# Patient Record
Sex: Female | Born: 1965
Health system: Southern US, Community
[De-identification: ages and names within clinical notes are randomized; demographics above are authoritative.]

## PROBLEM LIST (undated history)

## (undated) DIAGNOSIS — E611 Iron deficiency: Secondary | ICD-10-CM

## (undated) DIAGNOSIS — K648 Other hemorrhoids: Secondary | ICD-10-CM

## (undated) DIAGNOSIS — G8929 Other chronic pain: Secondary | ICD-10-CM

## (undated) DIAGNOSIS — E669 Obesity, unspecified: Secondary | ICD-10-CM

## (undated) DIAGNOSIS — K219 Gastro-esophageal reflux disease without esophagitis: Secondary | ICD-10-CM

## (undated) DIAGNOSIS — G4700 Insomnia, unspecified: Secondary | ICD-10-CM

## (undated) DIAGNOSIS — M549 Dorsalgia, unspecified: Secondary | ICD-10-CM

## (undated) DIAGNOSIS — K644 Residual hemorrhoidal skin tags: Secondary | ICD-10-CM

## (undated) DIAGNOSIS — D649 Anemia, unspecified: Secondary | ICD-10-CM

## (undated) DIAGNOSIS — G44229 Chronic tension-type headache, not intractable: Secondary | ICD-10-CM

## (undated) DIAGNOSIS — O021 Missed abortion: Secondary | ICD-10-CM

## (undated) DIAGNOSIS — K449 Diaphragmatic hernia without obstruction or gangrene: Secondary | ICD-10-CM

## (undated) HISTORY — DX: Iron deficiency: E61.1

## (undated) HISTORY — DX: Obesity, unspecified: E66.9

## (undated) HISTORY — PX: DIAGNOSTIC LAPAROSCOPY: SUR761

## (undated) HISTORY — DX: Other hemorrhoids: K64.8

## (undated) HISTORY — DX: Gastro-esophageal reflux disease without esophagitis: K21.9

## (undated) HISTORY — DX: Diaphragmatic hernia without obstruction or gangrene: K44.9

## (undated) HISTORY — PX: UPPER GASTROINTESTINAL ENDOSCOPY: SHX188

## (undated) HISTORY — PX: WISDOM TOOTH EXTRACTION: SHX21

## (undated) HISTORY — DX: Residual hemorrhoidal skin tags: K64.4

## (undated) HISTORY — DX: Chronic tension-type headache, not intractable: G44.229

## (undated) HISTORY — PX: COLONOSCOPY: SHX174

## (undated) HISTORY — PX: OTHER SURGICAL HISTORY: SHX169

---

## 2002-01-30 HISTORY — PX: OTHER SURGICAL HISTORY: SHX169

## 2011-05-24 ENCOUNTER — Other Ambulatory Visit: Payer: Self-pay | Admitting: Obstetrics and Gynecology

## 2011-05-25 ENCOUNTER — Encounter (HOSPITAL_COMMUNITY): Payer: Self-pay | Admitting: *Deleted

## 2011-06-05 ENCOUNTER — Encounter (HOSPITAL_COMMUNITY): Payer: Self-pay | Admitting: Pharmacist

## 2011-06-07 ENCOUNTER — Other Ambulatory Visit: Payer: Self-pay | Admitting: Obstetrics and Gynecology

## 2011-06-07 NOTE — H&P (Signed)
48/08[birth year]2011  Reason for Appointment  1. PreOp for 06/14/11   History of Present Illness  General:  46 y/o G4P2A2 presents for preop for HTA ablation on 06/14/11. Pt is without complaints. She reports her last menstrual period was just spotting. She has taken Provera 10 mg x 10 days last month to help with menometrorrhagia. Pt's workup has revealed a slightly enlarged uterus, EMB negative.  Pt denies any recent illnesses.   Current Medications  Amitriptyline HCl 50 MG Tablet 1 tablet at bedtime qhs every night  Gabapentin 300 MG Capsule 1 capsule qhs prn insomnia initially prescribed for low back pain  Prilosec 20 MG Capsule Delayed Release 1 capsule Once a day  Hemax 150-1 MG Tablet Extended Release 1 caplet Once a day  Medication List reviewed and reconciled with the patient   Past Medical History  GYN in [place] Dr. Summer...has not established locally since moving 08/2010  Laryngopharyngeal reflux  GERD  iron deficiency anemia...10.4 when checked 06/28/07; I do not see records of upper endoscopy or colonoscopy in outside medical records provided  Obesity status post lap band procedure and  history right low thoracic pain with paracentral disc protrusions on the left at T7-8 and and and T9-10 right disc protrusion seen on MRI 09/19/1998 and  history of tension headaches treated with combination of Elavil 20 and is and Effexor XR 75 mg   Surgical History  Lap Band   C-Section x 2   Ectopic Pregnancy   laparoscopic left ovarian cystectomy 11/2009   Family History  Father: alive Hypertension, Skin Cancer (not sure type), Lymphoma, Esophageal Cancer; smoker   Mother: alive Colon & Renal Cancer; smoker   Paternal Grand Father: deceased Esophageal cancer   Paternal Grand Mother: deceased Brain Cancer   Maternal Grand Father: deceased stomach Cancer   Maternal Grand Mother: deceased Breast cancer   no early heart disease, no ovarian ca-that she is aware of.   Social History  General:    History of smoking  cigarettes: Never smoked no Smoking.  Alcohol: yes, Rare.  no Recreational drug use.  Exercise: yes, walks daily for 30 minutes at least.  Occupation: unemployed.  Marital Status: married.  Children: Ben - 12 & Luke -10.  Religion: yes, Christian.  Seat belt use: no, always, mostly.    Gyn History  Sexual activity currently sexually active.  Periods : irregular, very sparatic this past year.  LMP 05/30/11- spotting.  Denies H/O Birth control vasectomy, removal of fallopian tubes.  Last pap smear date within the past 2 years--Dr. Swyane 10/29/2009-Records.  Last mammogram date within the last two years.  Denies H/O Abnormal pap smear .  Denies H/O STD .  GYN procedures Endometrial Biopsy 03/2011, WNL.    OB History  Number of pregnancies 4.  miscarriages 1, 1 ectopic pregnancy-around 1986.  Pregnancy # 1 C-section delivery.  Pregnancy # 2 C-section.    Allergies  Sulfa drugs (for allergy): migraines   Hospitalization/Major Diagnostic Procedure  See Surgery   not in the past year 03/2011   Review of Systems  No SOB, chest pain, N/V/D, fever chills.   Vital Signs  Wt 181, Wt change 1 lb, Pulse sitting 72, BP sitting 120/80.   Physical Examination  GENERAL:  Patient appears in NAD, pleasant.  Build: well developed.  General Appearance: well-appearing, overweight.  Race: caucasian.  NECK:  ROM: normal.  Thyroid: no thyromegaly, non tender.  LUNGS:  Breath sounds: clear to auscultation.  Dyspnea: no.  HEART:    Murmurs: none.  Rate: normal.  Rhythm: regular.  ABDOMEN:  General: no masses,tenderness,organomegaly, , BS normal, non distended.  FEMALE GENITOURINARY:  Adnexa: no mass, non tender.  Anus/perineum: normal, no lesions.  Cervix/ cuff: normal appearance , no lesions/discharge/bleeding, good pelvic support .  External genitalia: normal, no lesions, no skin discoloration.  Rectum: deferred.  Urethra: normal external meatus.  Uterus:  normal size/shape/consistency, freely mobile, non tender, Anteverted, sharply anteflexed.  Vagina: pink/moist mucosa, no lesions, no abnormal discharge, odorless, clear white mucus.  Vulva: normal, no lesions, no skin discoloration.  EXTREMITIES:  Extremities no clubbing cyanosis or edema present.  NEUROLOGICAL:  gross motor and sensory grossly intact.  Orientation: alert and oriented x 3.     Assessments   1. Preop examination - V72.84 (Primary)   2. Menometrorrhagia - 626.2   3. Vaginal Discharge, NOS - 623.5   Treatment  1. Vaginal Discharge, NOS  LAB: [place] Normal   CLUE CELLS None seen -   TRICHOMONAS None Seen -   WBCS Normal -   YEAST None seen -    Bellah Alia B 48/08/2011 03:07:38 PM > Normal discharge.        Follow Up  2 weeks (Reason: post op)     

## 2011-06-13 MED ORDER — CEFAZOLIN SODIUM-DEXTROSE 2-3 GM-% IV SOLR
2.0000 g | INTRAVENOUS | Status: AC
  Filled 2011-06-13: qty 50

## 2011-06-14 ENCOUNTER — Encounter (HOSPITAL_COMMUNITY): Payer: Self-pay | Admitting: Anesthesiology

## 2011-06-14 ENCOUNTER — Ambulatory Visit (HOSPITAL_COMMUNITY)
Admission: RE | Admit: 2011-06-14 | Discharge: 2011-06-14 | Disposition: A | Discharge status: Home / Self Care | Payer: Managed Care, Other (non HMO) | Source: Ambulatory Visit | Attending: Obstetrics and Gynecology | Admitting: Obstetrics and Gynecology

## 2011-06-14 ENCOUNTER — Encounter (HOSPITAL_COMMUNITY): Payer: Self-pay | Admitting: *Deleted

## 2011-06-14 ENCOUNTER — Encounter (HOSPITAL_COMMUNITY)
Admission: RE | Disposition: A | Discharge status: Home / Self Care | Payer: Self-pay | Source: Ambulatory Visit | Attending: Obstetrics and Gynecology

## 2011-06-14 ENCOUNTER — Ambulatory Visit (HOSPITAL_COMMUNITY): Payer: Managed Care, Other (non HMO) | Admitting: Anesthesiology

## 2011-06-14 DIAGNOSIS — N920 Excessive and frequent menstruation with regular cycle: Secondary | ICD-10-CM | POA: Insufficient documentation

## 2011-06-14 DIAGNOSIS — N949 Unspecified condition associated with female genital organs and menstrual cycle: Secondary | ICD-10-CM | POA: Insufficient documentation

## 2011-06-14 HISTORY — DX: Missed abortion: O02.1

## 2011-06-14 HISTORY — DX: Dorsalgia, unspecified: M54.9

## 2011-06-14 HISTORY — DX: Insomnia, unspecified: G47.00

## 2011-06-14 HISTORY — DX: Gastro-esophageal reflux disease without esophagitis: K21.9

## 2011-06-14 HISTORY — DX: Anemia, unspecified: D64.9

## 2011-06-14 HISTORY — PX: HYSTEROSCOPY WITH THERMACHOICE: SHX5396

## 2011-06-14 HISTORY — DX: Other chronic pain: G89.29

## 2011-06-14 LAB — CBC
HCT: 39.5 % (ref 36.0–46.0)
Hemoglobin: 12.8 g/dL (ref 12.0–15.0)
WBC: 4.4 10*3/uL (ref 4.0–10.5)

## 2011-06-14 LAB — DIFFERENTIAL
Basophils Absolute: 0 10*3/uL (ref 0.0–0.1)
Lymphocytes Relative: 36 % (ref 12–46)
Lymphs Abs: 1.6 10*3/uL (ref 0.7–4.0)
Monocytes Absolute: 0.4 10*3/uL (ref 0.1–1.0)
Monocytes Relative: 8 % (ref 3–12)
Neutro Abs: 2.3 10*3/uL (ref 1.7–7.7)

## 2011-06-14 SURGERY — DILATATION & CURETTAGE/HYSTEROSCOPY WITH HYDROTHERMAL ABLATION
Anesthesia: General | Site: Uterus | Wound class: Clean Contaminated

## 2011-06-14 MED ORDER — PROPOFOL 10 MG/ML IV EMUL
INTRAVENOUS | Status: AC
  Filled 2011-06-14: qty 20

## 2011-06-14 MED ORDER — FENTANYL CITRATE 0.05 MG/ML IJ SOLN
INTRAMUSCULAR | Status: AC
  Filled 2011-06-14: qty 5

## 2011-06-14 MED ORDER — FENTANYL CITRATE 0.05 MG/ML IJ SOLN
INTRAMUSCULAR | Status: DC | PRN
  Administered 2011-06-14 (×5): 50 ug via INTRAVENOUS

## 2011-06-14 MED ORDER — SODIUM CHLORIDE 0.9 % IR SOLN
Status: DC | PRN
  Administered 2011-06-14 (×2): 1

## 2011-06-14 MED ORDER — ONDANSETRON HCL 4 MG/2ML IJ SOLN
INTRAMUSCULAR | Status: AC
  Filled 2011-06-14: qty 2

## 2011-06-14 MED ORDER — ONDANSETRON HCL 4 MG/2ML IJ SOLN
INTRAMUSCULAR | Status: DC | PRN
  Administered 2011-06-14: 4 mg via INTRAVENOUS

## 2011-06-14 MED ORDER — KETOROLAC TROMETHAMINE 30 MG/ML IJ SOLN: 15.0000 mg | Freq: Once | INTRAMUSCULAR | Status: DC | PRN

## 2011-06-14 MED ORDER — MIDAZOLAM HCL 5 MG/5ML IJ SOLN
INTRAMUSCULAR | Status: DC | PRN
  Administered 2011-06-14: 2 mg via INTRAVENOUS

## 2011-06-14 MED ORDER — KETOROLAC TROMETHAMINE 30 MG/ML IJ SOLN
INTRAMUSCULAR | Status: DC | PRN
  Administered 2011-06-14: 60 mg via INTRAVENOUS

## 2011-06-14 MED ORDER — PROPOFOL 10 MG/ML IV EMUL
INTRAVENOUS | Status: DC | PRN
  Administered 2011-06-14: 200 mg via INTRAVENOUS

## 2011-06-14 MED ORDER — LIDOCAINE HCL (CARDIAC) 20 MG/ML IV SOLN
INTRAVENOUS | Status: DC | PRN
  Administered 2011-06-14: 70 mg via INTRAVENOUS

## 2011-06-14 MED ORDER — LACTATED RINGERS IV SOLN
INTRAVENOUS | Status: DC | PRN
  Administered 2011-06-14 (×3): via INTRAVENOUS

## 2011-06-14 MED ORDER — FENTANYL CITRATE 0.05 MG/ML IJ SOLN
25.0000 ug | INTRAMUSCULAR | Status: DC | PRN
  Administered 2011-06-14 (×2): 50 ug via INTRAVENOUS

## 2011-06-14 MED ORDER — FENTANYL CITRATE 0.05 MG/ML IJ SOLN
INTRAMUSCULAR | Status: AC
  Administered 2011-06-14: 50 ug via INTRAVENOUS
  Filled 2011-06-14: qty 2

## 2011-06-14 MED ORDER — CEFAZOLIN SODIUM 1-5 GM-% IV SOLN
INTRAVENOUS | Status: AC
  Filled 2011-06-14: qty 50

## 2011-06-14 MED ORDER — MIDAZOLAM HCL 2 MG/2ML IJ SOLN
INTRAMUSCULAR | Status: AC
  Filled 2011-06-14: qty 2

## 2011-06-14 MED ORDER — SILVER NITRATE-POT NITRATE 75-25 % EX MISC
CUTANEOUS | Status: DC | PRN
  Administered 2011-06-14: 1 via TOPICAL

## 2011-06-14 MED ORDER — LIDOCAINE HCL 2 % IJ SOLN
INTRAMUSCULAR | Status: DC | PRN
  Administered 2011-06-14: 6 mL

## 2011-06-14 MED ORDER — DEXAMETHASONE SODIUM PHOSPHATE 4 MG/ML IJ SOLN
INTRAMUSCULAR | Status: DC | PRN
  Administered 2011-06-14: 10 mg via INTRAVENOUS

## 2011-06-14 MED ORDER — LIDOCAINE HCL 2 % IJ SOLN
INTRAMUSCULAR | Status: AC
  Filled 2011-06-14: qty 1

## 2011-06-14 MED ORDER — LIDOCAINE HCL (CARDIAC) 20 MG/ML IV SOLN
INTRAVENOUS | Status: AC
  Filled 2011-06-14: qty 5

## 2011-06-14 MED ORDER — KETOROLAC TROMETHAMINE 60 MG/2ML IM SOLN
INTRAMUSCULAR | Status: AC
  Filled 2011-06-14: qty 2

## 2011-06-14 MED ORDER — CEFAZOLIN SODIUM 1-5 GM-% IV SOLN
INTRAVENOUS | Status: DC | PRN
  Administered 2011-06-14: 2 g via INTRAVENOUS

## 2011-06-14 SURGICAL SUPPLY — 18 items
CANISTER SUCTION 2500CC (MISCELLANEOUS) ×3 IMPLANT
CATH ROBINSON RED A/P 16FR (CATHETERS) ×3 IMPLANT
CATH THERMACHOICE III (CATHETERS) ×3 IMPLANT
CLOTH BEACON ORANGE TIMEOUT ST (SAFETY) ×3 IMPLANT
CONTAINER PREFILL 10% NBF 60ML (FORM) ×3 IMPLANT
DILATOR CANAL MILEX (MISCELLANEOUS) ×3 IMPLANT
GLOVE BIO SURGEON STRL SZ7 (GLOVE) ×3 IMPLANT
GLOVE BIOGEL PI IND STRL 7.0 (GLOVE) ×4 IMPLANT
GLOVE BIOGEL PI INDICATOR 7.0 (GLOVE) ×2
GOWN PREVENTION PLUS LG XLONG (DISPOSABLE) ×3 IMPLANT
GOWN STRL REIN XL XLG (GOWN DISPOSABLE) ×3 IMPLANT
IV LACTATED RINGER IRRG 3000ML (IV SOLUTION) ×1
IV LR IRRIG 3000ML ARTHROMATIC (IV SOLUTION) ×2 IMPLANT
PACK HYSTEROSCOPY LF (CUSTOM PROCEDURE TRAY) ×3 IMPLANT
SET GENESYS HTA PROCERVA (MISCELLANEOUS) ×3 IMPLANT
SET TUBE HYSTEROSCOPIC INFLOW (TUBING) ×3 IMPLANT
TOWEL OR 17X24 6PK STRL BLUE (TOWEL DISPOSABLE) ×6 IMPLANT
WATER STERILE IRR 1000ML POUR (IV SOLUTION) ×3 IMPLANT

## 2011-06-14 NOTE — Anesthesia Preprocedure Evaluation (Signed)
Anesthesia Evaluation  Patient identified by MRN, date of birth, ID band Patient awake    Reviewed: Allergy & Precautions, H&P , NPO status , Patient's Chart, lab work & pertinent test results, reviewed documented beta blocker date and time   Airway Mallampati: I TM Distance: >3 FB Neck ROM: full    Dental  (+) Teeth Intact   Pulmonary neg pulmonary ROS,  breath sounds clear to auscultation  Pulmonary exam normal       Cardiovascular Exercise Tolerance: Good negative cardio ROS  Rhythm:regular Rate:Normal     Neuro/Psych negative neurological ROS  negative psych ROS   GI/Hepatic Neg liver ROS, GERD-  Medicated,  Endo/Other  negative endocrine ROS  Renal/GU negative Renal ROS  negative genitourinary   Musculoskeletal   Abdominal   Peds  Hematology negative hematology ROS (+)   Anesthesia Other Findings   Reproductive/Obstetrics negative OB ROS                           Anesthesia Physical Anesthesia Plan  ASA: II  Anesthesia Plan: General LMA   Post-op Pain Management:    Induction:   Airway Management Planned:   Additional Equipment:   Intra-op Plan:   Post-operative Plan:   Informed Consent: I have reviewed the patients History and Physical, chart, labs and discussed the procedure including the risks, benefits and alternatives for the proposed anesthesia with the patient or authorized representative who has indicated his/her understanding and acceptance.   Dental Advisory Given  Plan Discussed with: CRNA and Surgeon  Anesthesia Plan Comments:         Anesthesia Quick Evaluation

## 2011-06-14 NOTE — H&P (View-Only) (Signed)
06/07/2011  Reason for Appointment  1. PreOp for 06/14/11   History of Present Illness  General:  46 y/o G4P2A2 presents for preop for HTA ablation on 06/14/11. Pt is without complaints. She reports her last menstrual period was just spotting. She has taken Provera 10 mg x 10 days last month to help with menometrorrhagia. Pt's workup has revealed a slightly enlarged uterus, EMB negative.  Pt denies any recent illnesses.   Current Medications  Amitriptyline HCl 50 MG Tablet 1 tablet at bedtime qhs every night  Gabapentin 300 MG Capsule 1 capsule qhs prn insomnia initially prescribed for low back pain  Prilosec 20 MG Capsule Delayed Release 1 capsule Once a day  Hemax 150-1 MG Tablet Extended Release 1 caplet Once a day  Medication List reviewed and reconciled with the patient   Past Medical History  [place] in [place] Dr. Vaughan Basta...has not established locally since moving 08/2010  Laryngopharyngeal reflux  GERD  iron deficiency anemia...10.4 when checked 06/28/07; I do not see records of upper endoscopy or colonoscopy in outside medical records provided  Obesity status post lap band procedure and  history right low thoracic pain with paracentral disc protrusions on the left at T7-8 and and and T9-10 right disc protrusion seen on MRI 09/19/1998 and  history of tension headaches treated with combination of Elavil 20 and is and Effexor XR 75 mg   Surgical History  Lap Band   C-Section x 2   Ectopic Pregnancy   laparoscopic left ovarian cystectomy 11/2009   Family History  Father: alive Hypertension, Skin Cancer (not sure type), Lymphoma, Esophageal Cancer; smoker   Mother: alive Colon & Renal Cancer; smoker   Paternal Grand Father: deceased Esophageal cancer   Paternal Grand Mother: deceased Brain Cancer   Maternal Grand Father: deceased stomach Cancer   Maternal Grand Mother: deceased Breast cancer   no early heart disease, no ovarian ca-that she is aware of.   Social History  General:    History of smoking  cigarettes: Never smoked no Smoking.  Alcohol: yes, Rare.  no Recreational drug use.  Exercise: yes, walks daily for 30 minutes at least.  Occupation: unemployed.  Marital Status: married.  Children: Ben - 12 & Luke -10.  Religion: yes, Christian.  Seat belt use: no, always, mostly.    Gyn History  Sexual activity currently sexually active.  Periods : irregular, very sparatic this past year.  LMP 05/30/11- spotting.  Denies H/O Birth control vasectomy, removal of fallopian tubes.  Last pap smear date within the past 2 years--Dr. Bess Harvest 10/29/2009-Records.  Last mammogram date within the last two years.  Denies H/O Abnormal pap smear .  Denies H/O STD .  GYN procedures Endometrial Biopsy 03/2011, WNL.    OB History  Number of pregnancies 4.  miscarriages 1, 1 ectopic pregnancy-around 1986.  Pregnancy # 1 C-section delivery.  Pregnancy # 2 C-section.    Allergies  Sulfa drugs (for allergy): migraines   Hospitalization/Major Diagnostic Procedure  See Surgery   not in the past year 03/2011   Review of Systems  No SOB, chest pain, N/V/D, fever chills.   Vital Signs  Wt 181, Wt change 1 lb, Pulse sitting 72, BP sitting 120/80.   Physical Examination  GENERAL:  Patient appears in NAD, pleasant.  Build: well developed.  General Appearance: well-appearing, overweight.  Race: caucasian.  NECK:  ROM: normal.  Thyroid: no thyromegaly, non tender.  LUNGS:  Breath sounds: clear to auscultation.  Dyspnea: no.  HEART:  Murmurs: none.  Rate: normal.  Rhythm: regular.  ABDOMEN:  General: no masses,tenderness,organomegaly, , BS normal, non distended.  FEMALE GENITOURINARY:  Adnexa: no mass, non tender.  Anus/perineum: normal, no lesions.  Cervix/ cuff: normal appearance , no lesions/discharge/bleeding, good pelvic support .  External genitalia: normal, no lesions, no skin discoloration.  Rectum: deferred.  Urethra: normal external meatus.  Uterus:  normal size/shape/consistency, freely mobile, non tender, Anteverted, sharply anteflexed.  Vagina: pink/moist mucosa, no lesions, no abnormal discharge, odorless, clear white mucus.  Vulva: normal, no lesions, no skin discoloration.  EXTREMITIES:  Extremities no clubbing cyanosis or edema present.  NEUROLOGICAL:  gross motor and sensory grossly intact.  Orientation: alert and oriented x 3.     Assessments   1. Preop examination - V72.84 (Primary)   2. Menometrorrhagia - 626.2   3. Vaginal Discharge, NOS - 623.5   Treatment  1. Vaginal Discharge, NOS  LAB: [place] Normal   CLUE CELLS None seen -   TRICHOMONAS None Seen -   WBCS Normal -   YEAST None seen -    Nahmir Zeidman B 06/07/2011 03:07:38 PM > Normal discharge.        Follow Up  2 weeks (Reason: post op)

## 2011-06-14 NOTE — Brief Op Note (Signed)
06/14/2011  2:34 PM  PATIENT:  Heidi Jordan  46 y.o. female  PRE-OPERATIVE DIAGNOSIS:  Menometrorrhagia  POST-OPERATIVE DIAGNOSIS:  Menometrorrhagia  PROCEDURE:  Procedure(s) (LRB): DILATATION & CURETTAGE/HYSTEROSCOPY Failed  HYDROTHERMAL ABLATION (N/A) HYSTEROSCOPY WITH THERMACHOICE ()  SURGEON:  Surgeon(s) and Role:    * Geryl Rankins, MD - Primary  PHYSICIAN ASSISTANT: None  ASSISTANTS: none   ANESTHESIA:   local and general  EBL:  Total I/O In: 1000 [I.V.:1000] Out: 150 [Urine:100; Blood:50]  BLOOD ADMINISTERED:none  DRAINS: I/O prior to procedure   LOCAL MEDICATIONS USED:  2% LIDOCAINE 6 cc  SPECIMEN:  Source of Specimen:  Endometrial currettings  DISPOSITION OF SPECIMEN:  PATHOLOGY  COUNTS:  YES  TOURNIQUET:  * No tourniquets in log *  DICTATION: .Other Dictation: Dictation Number [id number]  PLAN OF CARE: Discharge to home after PACU  PATIENT DISPOSITION:  PACU - hemodynamically stable.   Delay start of Pharmacological VTE agent (>24hrs) due to surgical blood loss or risk of bleeding: not applicable

## 2011-06-14 NOTE — Discharge Instructions (Signed)
Endometrial Ablation Endometrial ablation removes the lining of the uterus (endometrium). It is usually a same day, outpatient treatment. Ablation helps avoid major surgery (such as a hysterectomy). A hysterectomy is removal of the cervix and uterus. Endometrial ablation has less risk and complications, has a shorter recovery period and is less expensive. After endometrial ablation, most women will have little or no menstrual bleeding. You may not keep your fertility. Pregnancy is no longer likely after this procedure but if you are pre-menopausal, you still need to use a reliable method of birth control following the procedure because pregnancy can occur. REASONS TO HAVE THE PROCEDURE MAY INCLUDE:  Heavy periods.   Bleeding that is causing anemia.   Anovulatory bleeding, very irregular, bleeding.   Bleeding submucous fibroids (on the lining inside the uterus) if they are smaller than 3 centimeters.  REASONS NOT TO HAVE THE PROCEDURE MAY INCLUDE:  You wish to have more children.   You have a pre-cancerous or cancerous problem. The cause of any abnormal bleeding must be diagnosed before having the procedure.   You have pain coming from the uterus.   You have a submucus fibroid larger than 3 centimeters.   You recently had a baby.   You recently had an infection in the uterus.   You have a severe retro-flexed, tipped uterus and cannot insert the instrument to do the ablation.   You had a Cesarean section or deep major surgery on the uterus.   The inner cavity of the uterus is too large for the endometrial ablation instrument.  RISKS AND COMPLICATIONS   Perforation of the uterus.   Bleeding.   Infection of the uterus, bladder or vagina.   Injury to surrounding organs.   Cutting the cervix.   An air bubble to the lung (air embolus).   Pregnancy following the procedure.   Failure of the procedure to help the problem requiring hysterectomy.   Decreased ability to diagnose  cancer in the lining of the uterus.  BEFORE THE PROCEDURE  The lining of the uterus must be tested to make sure there is no pre-cancerous or cancer cells present.   Medications may be given to make the lining of the uterus thinner.   Ultrasound may be used to evaluate the size and look for abnormalities of the uterus.   Future pregnancy is not desired.  PROCEDURE  There are different ways to destroy the lining of the uterus.   Resectoscope - radio frequency-alternating electric current is the most common one used.   Cryotherapy - freezing the lining of the uterus.   Heated Free Liquid - heated salt (saline) solution inserted into the uterus.   Microwave - uses high energy microwaves in the uterus.   Thermal Balloon - a catheter with a balloon tip is inserted into the uterus and filled with heated fluid.  Your caregiver will talk with you about the method used in this clinic. They will also instruct you on the pros and cons of the procedure. Endometrial ablation is performed along with a procedure called operative hysteroscopy. A narrow viewing tube is inserted through the birth canal (vagina) and through the cervix into the uterus. A tiny camera attached to the viewing tube (hysteroscope) allows the uterine cavity to be shown on a TV monitor during surgery. Your uterus is filled with a harmless liquid to make the procedure easier. The lining of the uterus is then removed. The lining can also be removed with a resectoscope which allows your surgeon   to cut away the lining of the uterus under direct vision. Usually, you will be able to go home within an hour after the procedure. HOME CARE INSTRUCTIONS   Do not drive for 24 hours.   No tampons, douching or intercourse for 2 weeks or until your caregiver approves.   Rest at home for 24 to 48 hours. You may then resume normal activities unless told differently by your caregiver.   Take your temperature two times a day for 4 days, and record  it.   Take any medications your caregiver has ordered, as directed.   Use some form of contraception if you are pre-menopausal and do not want to get pregnant.  Bleeding after the procedure is normal. It varies from light spotting and mildly watery to bloody discharge for 4 to 6 weeks. You may also have mild cramping. Only take over-the-counter or prescription medicines for pain, discomfort, or fever as directed by your caregiver. Do not use aspirin, as this may aggravate bleeding. Frequent urination during the first 24 hours is normal. You will not know how effective your surgery is until at least 3 months after the surgery. SEEK IMMEDIATE MEDICAL CARE IF:   Bleeding is heavier than a normal menstrual cycle.   An oral temperature above 102 F (38.9 C) develops.   You have increasing cramps or pains not relieved with medication or develop belly (abdominal) pain which does not seem to be related to the same area of earlier cramping and pain.   You are light headed, weak or have fainting episodes.   You develop pain in the shoulder strap areas.   You have chest or leg pain.   You have abnormal vaginal discharge.   You have painful urination.  Document Released: 11/26/2003 Document Revised: 01/05/2011 Document Reviewed: 02/23/2007 ExitCare Patient Information 2012 Exit[employer], LL[employer]CHARGE INSTRUCTIONS: D&C / D&E The following instructions have been prepared to help you care for yourself upon your return home.   Personal hygiene: Marland Kitchen Use sanitary pads for vaginal drainage, not tampons. . Shower the day after your procedure. . NO tub baths, pools or Jacuzzis for 2-3 weeks. . Wipe front to back after using the bathroom.  Activity and limitations: . Do NOT drive or operate any equipment for 24 hours. The effects of anesthesia are still present and drowsiness may result. . Do NOT rest in bed all day. . Walking is encouraged. . Walk up and down stairs slowly. . You may resume your normal  activity in one to two days or as indicated by your physician.  Sexual activity: NO intercourse for at least 2 weeks after the procedure, or as indicated by your physician.  Diet: Eat a light meal as desired this evening. You may resume your usual diet tomorrow.  Return to work: You may resume your work activities in one to two days or as indicated by your doctor.  What to expect after your surgery: Expect to have vaginal bleeding/discharge for 2-3 days and spotting for up to 10 days. It is not unusual to have soreness for up to 1-2 weeks. You may have a slight burning sensation when you urinate for the first day. Mild cramps may continue for a couple of days. You may have a regular period in 2-6 weeks.  Call your doctor for any of the following: . Excessive vaginal bleeding, saturating and changing one pad every hour. . Inability to urinate 6 hours after discharge from hospital. . Pain not relieved by pain medication. . Fever  of 100.4 F or greater. . Unusual vaginal discharge or odor.  Return to office ________________ Call for an appointment ___________________  Patient's signature: ______________________  Nurse's signature ________________________  Post Anesthesia Care Unit [phone]  .

## 2011-06-14 NOTE — Interval H&P Note (Signed)
History and Physical Interval Note:  06/14/2011 12:10 PM  Heidi Jordan  has presented today for surgery, with the diagnosis of Menometrorrhagia  The various methods of treatment have been discussed with the patient and family. After consideration of risks, benefits and other options for treatment, the patient has consented to  Procedure(s) (LRB): DILATATION & CURETTAGE/HYSTEROSCOPY WITH HYDROTHERMAL ABLATION (N/A) as a surgical intervention .  The patients' history has been reviewed, patient examined, no change in status, stable for surgery.  I have reviewed the patients' chart and labs.  Questions were answered to the patient's satisfaction.     Dion Body, Bartosz Luginbill

## 2011-06-14 NOTE — Preoperative (Signed)
Beta Blockers   Reason not to administer Beta Blockers:Not Applicable 

## 2011-06-14 NOTE — Transfer of Care (Signed)
Immediate Anesthesia Transfer of Care Note  Patient: Heidi Jordan  Procedure(s) Performed: Procedure(s) (LRB): DILATATION & CURETTAGE/HYSTEROSCOPY WITH HYDROTHERMAL ABLATION (N/A)  Patient Location: PACU  Anesthesia Type: General  Level of Consciousness: awake, alert , oriented and patient cooperative  Airway & Oxygen Therapy: Patient Spontanous Breathing and Patient connected to nasal cannula oxygen  Post-op Assessment: Report given to PACU RN and Post -op Vital signs reviewed and stable  Post vital signs: Reviewed and stable  Complications: No apparent anesthesia complications

## 2011-06-15 ENCOUNTER — Encounter (HOSPITAL_COMMUNITY): Payer: Self-pay | Admitting: Obstetrics and Gynecology

## 2011-06-15 NOTE — Op Note (Addendum)
Heidi Jordan, Heidi Jordan NO.:  [id number]  MEDICAL RECORD NO.:  [id number]  LOCATION:  [facility]                          FACILITY:  [facility]  PHYSICIAN:  Pieter Partridge, MD   DATE OF BIRTH:  1965-05-11  DATE OF PROCEDURE:  06/14/2011 DATE OF DISCHARGE:  06/14/2011                              OPERATIVE REPORT   PREOPERATIVE DIAGNOSIS:  Menometrorrhagia.  PROCEDURE:  Hysteroscopy, D and C, failed hydrothermal ablation. Endometrial ablation via Thermachoice completed.  SURGEON:  Shela Nevin. Dion Body, MD  ASSISTANT:  None.  ANESTHESIA:  Local and general.  EBL:  50.  IV FLUIDS:  100.  URINE OUTPUT:  100.  BLOOD ADMINISTERED:  None.  2% lidocaine 6 mL.  SOURCE OF SPECIMEN:  Endometrial curettings.  DISPOSITION OF SPECIMEN:  To pathology.  PLAN OF CARE:  Discharged home after PACU.  DISPOSITION:  To PACU.  Hemodynamically stable.  FINDINGS:  Very long uterine cervix approximately may be 6 cm, moderate or copious amount of endometrial tissue noted.  Uterus without any endometrial abnormalities.  PROCEDURE IN DETAIL:  Heidi Jordan was identified in the holding area.  [name]  She was then placed in the dorsal lithotomy position, and prepped and draped in a normal sterile fashion.  A Graves speculum was inserted into the vagina.  The cervix was sharply tilted backwards.  A bimanual exam prior to procedure revealed a sharply anteverted uterus.  The anterior lip of the cervix was then injected with 2% lidocaine and then grasped with a single-tooth tenaculum and brought forward.  There was some stenosis upon initial dilation and then the cervical os finder was used.  The cervix was then dilated up to a 7 Hegar.  I attempted to advance the hysteroscope and I was only able to advance so far, and there were some issues, I thought was distention.  I then ultimately ended up  dilating up to a 10 to get through to get the hysteroscope past the cervix; however, that was unsuccessful.  I was unable to visualize the fundus and I was still in the cervix, so the HTA hysteroscope was removed and I did a D and C of the entire uterus.  After the D and C, we assembled another regular hysteroscope and because of an attempt to focus, the device was not focusing, so we got a new camera and then got a new scope, and then the focus was working properly.  Once we had the operating normal scope, the hysteroscope was easily advanced and I was able to see the uterine fundus that appeared to be very normal.  The cervix just looking at it appeared to be like 6 or 7 cm, very long.  I decided to do the Thermachoice ablation.  The catheter was inserted through the cervix sounding at about 11-12 cm, the balloon was Primed prior to that and then about a little over 25 mL of D5W was inserted into the balloon.  The starting pressure was 174.  The entire ablation took 8 minutes without any alarms.  All instruments were then removed from the uterus.  The  single-tooth tenaculum was removed and hemostasis at the tenaculum site was achieved with the silver nitrate stick.  The Graves speculum was then removed without issue.  The patient tolerated the procedure well.  All instrument, sponge, and needle counts were correct x3.  She was taken to the recovery room in stable condition.     Pieter Partridge, MD     EBV/MEDQ  D:  06/14/2011  T:  06/15/2011  Job:  [id number]

## 2011-06-15 NOTE — Anesthesia Postprocedure Evaluation (Signed)
Anesthesia Post Note  Patient: Heidi Jordan  Procedure(s) Performed: Procedure(s) (LRB): DILATATION & CURETTAGE/HYSTEROSCOPY WITH HYDROTHERMAL ABLATION (N/A) HYSTEROSCOPY WITH THERMACHOICE ()  Anesthesia type: General  Patient location: PACU  Post pain: Pain level controlled  Post assessment: Post-op Vital signs reviewed  Last Vitals:  Filed Vitals:   06/14/11 1545  BP:   Pulse:   Temp: 36.7 C  Resp: 16    Post vital signs: Reviewed  Level of consciousness: sedated  Complications: No apparent anesthesia complications

## 2011-10-06 ENCOUNTER — Other Ambulatory Visit: Payer: Self-pay | Admitting: Obstetrics and Gynecology

## 2011-10-06 DIAGNOSIS — Z1231 Encounter for screening mammogram for malignant neoplasm of breast: Secondary | ICD-10-CM

## 2011-11-10 ENCOUNTER — Ambulatory Visit
Admission: RE | Admit: 2011-11-10 | Discharge: 2011-11-10 | Disposition: A | Discharge status: Home / Self Care | Payer: Managed Care, Other (non HMO) | Source: Ambulatory Visit | Attending: Obstetrics and Gynecology | Admitting: Obstetrics and Gynecology

## 2011-11-10 DIAGNOSIS — Z1231 Encounter for screening mammogram for malignant neoplasm of breast: Secondary | ICD-10-CM

## 2013-03-01 ENCOUNTER — Encounter: Payer: Self-pay | Admitting: Cardiology

## 2014-03-03 ENCOUNTER — Other Ambulatory Visit: Payer: Self-pay | Admitting: Gastroenterology

## 2014-03-03 DIAGNOSIS — K2100 Gastro-esophageal reflux disease with esophagitis, without bleeding: Secondary | ICD-10-CM

## 2014-03-03 DIAGNOSIS — R112 Nausea with vomiting, unspecified: Secondary | ICD-10-CM

## 2014-03-06 ENCOUNTER — Ambulatory Visit
Admission: RE | Admit: 2014-03-06 | Discharge: 2014-03-06 | Disposition: A | Discharge status: Home / Self Care | Payer: 59 | Source: Ambulatory Visit | Attending: Gastroenterology | Admitting: Gastroenterology

## 2014-03-06 ENCOUNTER — Other Ambulatory Visit: Payer: Self-pay | Admitting: Gastroenterology

## 2014-03-06 DIAGNOSIS — R112 Nausea with vomiting, unspecified: Secondary | ICD-10-CM

## 2014-03-06 DIAGNOSIS — K2100 Gastro-esophageal reflux disease with esophagitis, without bleeding: Secondary | ICD-10-CM

## 2014-03-13 ENCOUNTER — Encounter (HOSPITAL_COMMUNITY): Payer: Self-pay | Admitting: *Deleted

## 2014-03-13 ENCOUNTER — Emergency Department (HOSPITAL_COMMUNITY)
Admission: EM | Admit: 2014-03-13 | Discharge: 2014-03-13 | Disposition: A | Discharge status: Home / Self Care | Payer: 59 | Attending: Emergency Medicine | Admitting: Emergency Medicine

## 2014-03-13 ENCOUNTER — Emergency Department (HOSPITAL_COMMUNITY): Payer: 59

## 2014-03-13 DIAGNOSIS — E669 Obesity, unspecified: Secondary | ICD-10-CM | POA: Diagnosis not present

## 2014-03-13 DIAGNOSIS — Z3202 Encounter for pregnancy test, result negative: Secondary | ICD-10-CM | POA: Insufficient documentation

## 2014-03-13 DIAGNOSIS — Z79899 Other long term (current) drug therapy: Secondary | ICD-10-CM | POA: Diagnosis not present

## 2014-03-13 DIAGNOSIS — G8929 Other chronic pain: Secondary | ICD-10-CM | POA: Diagnosis not present

## 2014-03-13 DIAGNOSIS — Z862 Personal history of diseases of the blood and blood-forming organs and certain disorders involving the immune mechanism: Secondary | ICD-10-CM | POA: Diagnosis not present

## 2014-03-13 DIAGNOSIS — R079 Chest pain, unspecified: Secondary | ICD-10-CM | POA: Insufficient documentation

## 2014-03-13 DIAGNOSIS — R110 Nausea: Secondary | ICD-10-CM | POA: Diagnosis not present

## 2014-03-13 DIAGNOSIS — K219 Gastro-esophageal reflux disease without esophagitis: Secondary | ICD-10-CM | POA: Diagnosis not present

## 2014-03-13 DIAGNOSIS — Z7982 Long term (current) use of aspirin: Secondary | ICD-10-CM | POA: Diagnosis not present

## 2014-03-13 LAB — CBC WITH DIFFERENTIAL/PLATELET
BASOS PCT: 1 % (ref 0–1)
Basophils Absolute: 0 10*3/uL (ref 0.0–0.1)
Eosinophils Absolute: 0.3 10*3/uL (ref 0.0–0.7)
Eosinophils Relative: 8 % — ABNORMAL HIGH (ref 0–5)
HCT: 37.5 % (ref 36.0–46.0)
HEMOGLOBIN: 12.8 g/dL (ref 12.0–15.0)
Lymphocytes Relative: 29 % (ref 12–46)
Lymphs Abs: 1 10*3/uL (ref 0.7–4.0)
MCH: 29.2 pg (ref 26.0–34.0)
MCHC: 34.1 g/dL (ref 30.0–36.0)
MCV: 85.6 fL (ref 78.0–100.0)
Monocytes Absolute: 0.3 10*3/uL (ref 0.1–1.0)
Monocytes Relative: 8 % (ref 3–12)
NEUTROS ABS: 1.8 10*3/uL (ref 1.7–7.7)
NEUTROS PCT: 54 % (ref 43–77)
PLATELETS: 234 10*3/uL (ref 150–400)
RBC: 4.38 MIL/uL (ref 3.87–5.11)
RDW: 13.2 % (ref 11.5–15.5)
WBC: 3.3 10*3/uL — AB (ref 4.0–10.5)

## 2014-03-13 LAB — BASIC METABOLIC PANEL
Anion gap: 6 (ref 5–15)
BUN: 14 mg/dL (ref 6–23)
CHLORIDE: 103 mmol/L (ref 96–112)
CO2: 26 mmol/L (ref 19–32)
Calcium: 8.6 mg/dL (ref 8.4–10.5)
Creatinine, Ser: 0.81 mg/dL (ref 0.50–1.10)
GFR calc Af Amer: >90 mL/min (ref >90)
GFR calc non Af Amer: 83 mL/min — ABNORMAL LOW (ref >90)
GLUCOSE: 85 mg/dL (ref 70–99)
POTASSIUM: 4 mmol/L (ref 3.5–5.1)
Sodium: 135 mmol/L (ref 135–145)

## 2014-03-13 LAB — I-STAT TROPONIN, ED
Troponin i, poc: 0 ng/mL (ref 0.00–0.08)
Troponin i, poc: 0 ng/mL (ref 0.00–0.08)

## 2014-03-13 MED ORDER — NITROGLYCERIN 2 % TD OINT
1.0000 [in_us] | TOPICAL_OINTMENT | Freq: Once | TRANSDERMAL | Status: AC
  Administered 2014-03-13: 1 [in_us] via TOPICAL
  Filled 2014-03-13: qty 1

## 2014-03-13 MED ORDER — ASPIRIN 325 MG PO TABS
325.0000 mg | ORAL_TABLET | Freq: Once | ORAL | Status: DC
Start: 2014-03-13 — End: 2014-03-13
  Filled 2014-03-13: qty 1

## 2014-03-13 NOTE — ED Notes (Signed)
Pt reports onset last night 10pm of mid chest pains that radiates up into her neck. Has nausea when episodes of pain occur. Denies cough or sob. ekg done at triage.

## 2014-03-13 NOTE — ED Provider Notes (Signed)
CSN: [id number]     Arrival date & time 03/13/14  0740 History   First MD Initiated Contact with Patient 03/13/14 0800     Chief Complaint  Patient presents with  . Chest Pain     (Consider location/radiation/quality/duration/timing/severity/associated sxs/prior Treatment) Patient is a 49 y.o. female presenting with chest pain. The history is provided by the patient and the spouse.  Chest Pain Pain location:  Substernal area Pain quality comment:  Squeezing Pain radiates to:  Neck Pain radiates to the back: no   Pain severity:  Moderate Onset quality:  Sudden Duration: 10pm last night 10-20 mins. Timing:  Intermittent Progression:  Waxing and waning Chronicity:  New Context: at rest   Relieved by:  Nothing Worsened by:  Nothing tried Ineffective treatments:  None tried Associated symptoms: nausea   Associated symptoms: no abdominal pain, no anxiety, no back pain, no claudication, no cough, no diaphoresis, no fatigue, no fever, no headache, no near-syncope, no numbness, no palpitations, no shortness of breath, not vomiting and no weakness   Risk factors: surgery   Risk factors: no birth control, no coronary artery disease, no diabetes mellitus, no high cholesterol, no hypertension, no immobilization, not female, no prior DVT/PE and no smoking     Past Medical History  Diagnosis Date  . Anemia   . Missed abortion     no surgery reuqired  . Insomnia     tx with amitriptyline  . GERD (gastroesophageal reflux disease)   . Chronic back pain     tx with ibuprofen  . Iron deficiency   . Obesity   . Chronic tension headaches    Past Surgical History  Procedure Laterality Date  . Wisdom tooth extraction    . Cesarean section      x 2  . Diagnostic laparoscopy      x 2 surg,for ectopic preg w/ removal of tube  . Diagnostic laparoscopy      endometriosis - removed tube  . Colonoscopy    . Upper gastrointestinal endoscopy    . Lap band surgery  2004  . Hysteroscopy with  thermachoice  06/14/2011    Procedure: HYSTEROSCOPY WITH THERMACHOICE;  Surgeon: Geryl RankinsEvelyn Varnado, MD;  Location: [facility];  Service: Gynecology;;  Donnie Ahoherma choice started at 1413   History reviewed. No pertinent family history. History  Substance Use Topics  . Smoking status: Never Smoker   . Smokeless tobacco: Never Used  . Alcohol Use: Yes     Comment: socially   OB History    No data available     Review of Systems  Constitutional: Negative for fever, chills, diaphoresis, activity change, appetite change and fatigue.  HENT: Negative for congestion, facial swelling, rhinorrhea and sore throat.   Eyes: Negative for photophobia and discharge.  Respiratory: Negative for cough, chest tightness and shortness of breath.   Cardiovascular: Positive for chest pain. Negative for palpitations, claudication, leg swelling and near-syncope.  Gastrointestinal: Positive for nausea. Negative for vomiting, abdominal pain and diarrhea.  Endocrine: Negative for polydipsia and polyuria.  Genitourinary: Negative for dysuria, frequency, difficulty urinating and pelvic pain.  Musculoskeletal: Negative for back pain, arthralgias, neck pain and neck stiffness.  Skin: Negative for color change and wound.  Allergic/Immunologic: Negative for immunocompromised state.  Neurological: Negative for facial asymmetry, weakness, numbness and headaches.  Hematological: Does not bruise/bleed easily.  Psychiatric/Behavioral: Negative for confusion and agitation.      Allergies  Sulfa drugs cross reactors  Home Medications   Prior to  Admission medications   Medication Sig Start Date End Date Taking? Authorizing Provider  amitriptyline (ELAVIL) 50 MG tablet Take 50 mg by mouth at bedtime.   Yes Historical Provider, MD  aspirin 325 MG tablet Take 325 mg by mouth daily.   Yes Historical Provider, MD  omeprazole (PRILOSEC) 40 MG capsule Take 40 mg by mouth daily.   Yes Historical Provider, MD   BP 123/85 mmHg  Pulse 75   Temp(Src) 98.2 F (36.8 C) (Oral)  Resp 14  SpO2 100% Physical Exam  Constitutional: She is oriented to person, place, and time. She appears well-developed and well-nourished. No distress.  HENT:  Head: Normocephalic and atraumatic.  Mouth/Throat: No oropharyngeal exudate.  Eyes: Pupils are equal, round, and reactive to light.  Neck: Normal range of motion. Neck supple.  Cardiovascular: Normal rate, regular rhythm and normal heart sounds.  Exam reveals no gallop and no friction rub.   No murmur heard. Pulmonary/Chest: Effort normal and breath sounds normal. No respiratory distress. She has no wheezes. She has no rales.  Abdominal: Soft. Bowel sounds are normal. She exhibits no distension and no mass. There is no tenderness. There is no rebound and no guarding.  Musculoskeletal: Normal range of motion. She exhibits no edema or tenderness.  Neurological: She is alert and oriented to person, place, and time.  Skin: Skin is warm and dry.  Psychiatric: She has a normal mood and affect.    ED Course  Procedures (including critical care time) Labs Review Labs Reviewed  CBC WITH DIFFERENTIAL/PLATELET - Abnormal; Notable for the following:    WBC 3.3 (*)    Eosinophils Relative 8 (*)    All other components within normal limits  BASIC METABOLIC PANEL - Abnormal; Notable for the following:    GFR calc non Af Amer 83 (*)    All other components within normal limits  I-STAT TROPOININ, ED  POC URINE PREG, ED  I-STAT TROPOININ, ED    Imaging Review Dg Abd Acute W/chest  03/13/2014   CLINICAL DATA:  Chest pain for 2 days with radiation into the neck  EXAM: ACUTE ABDOMEN SERIES (ABDOMEN 2 VIEW & CHEST 1 VIEW)  COMPARISON:  None.  FINDINGS: Cardiac shadow is within normal limits. The lungs are clear bilaterally.  Scattered large and small bowel gas is noted. Fecal material is noted within the colon. A gastric lap band is seen in satisfactory and stable position. No acute bony abnormality is  seen. No free air is noted. Calcification is noted in the right hemipelvis which may be related to uterine fibroid.  IMPRESSION: No acute abnormality identified.   Electronically Signed   By: Alcide Clever M.D.   On: 03/13/2014 09:18     EKG Interpretation   Date/Time:  Friday March 13 2014 07:43:57 EST Ventricular Rate:  78 PR Interval:  158 QRS Duration: 74 QT Interval:  346 QTC Calculation: 394 R Axis:   112 Text Interpretation:  Normal sinus rhythm Left posterior fascicular block  Anterior infarct , age undetermined Abnormal ECG No prior for comparision  Confirmed by Analiz Tvedt  MD, Lattie Cervi [phone]) on 03/13/2014 8:01:16 AM      MDM   Final diagnoses:  Chest pain    Pt is a 49 y.o. female with Pmhx as above who presents with CP since 10pm last night described as vice-like with radiation to neck with assoc nausea, no vom, diarphoresis. Pain intermittent w/o  Exacerbating or alleviating symptoms.  Chest pain resolved about 3 blocks away  from the emergency room and has not since returned.  She has been having some issues with her lap band recently and was totally deflated yesterday in office with [facility].  She denies abdominal pain or fevers.  On physical exam, vital signs are stable and she is in no acute distress.  Cardiopulmonary abdominal exam is benign. EKG with a left posterior fascicular block and age indeterminate anterior infarct without prior for comparison.  First troponin is negative.  Chest x-ray unremarkable.  Plan for repeat troponin.  Time of last chest pain onset around 6 AM, we'll repeat troponin around noon.   She is low risk for MACE using heart score, timi SCORE OF 0. Serial trop negative. I have spoke with pt about the importance of close outpt f/u. She agrees to return for reoccur ance of pain or new symptoms. She's not had recurrence of pain in the emergency department   Cala Bradford Karbowski evaluation in the Emergency Department is complete. It has been  determined that no acute conditions requiring further emergency intervention are present at this time. The patient/guardian have been advised of the diagnosis and plan. We have discussed signs and symptoms that warrant return to the ED, such as changes or worsening in symptoms, worsening pain, fevers, abdominal pain, inability to tolerate liquids.  We have discussed the importance of close outpatient PCP follow-up.  I've encouraged her to have a stress test as an outpatient.  She states that she does not want to do that at this point.      Toy Cookey, MD 03/13/14 [phone]

## 2014-03-13 NOTE — Discharge Instructions (Signed)

## 2014-03-13 NOTE — ED Notes (Signed)
Pt had her lap band drained yesterday morning at 10am.

## 2014-09-16 ENCOUNTER — Other Ambulatory Visit: Payer: Self-pay | Admitting: *Deleted

## 2014-09-16 NOTE — Patient Outreach (Signed)
[facility]) Care Management  09/16/2014  Heidi Jordan 1965-05-14 [id number]  RN received a request for benefit exception on this pt. Will review all information and seek assistance from administration due to the circumstances of this case.    Elliot Cousin, RN Care Management Coordinator [facility] Main Office [phone]

## 2014-09-22 ENCOUNTER — Other Ambulatory Visit: Payer: Self-pay | Admitting: *Deleted

## 2014-09-22 NOTE — Patient Outreach (Signed)
[facility]) Care Management  09/22/2014  Heidi Jordan 07-30-1965 [id number]   RN spoke with pt concerning her recent procedures concerning band refills on an existing band placed in 2005. Pt denied three bills related to refills on this band. RN presented this information to administration who in term will cover the expenses initial requested less the speciality co-pays provided the physician was a networking provider for all procedures facilitated. Pt has been updated with the information and to expected coverage however must seek her reimbursement from the provider she has covered in full for these procedures.   Pt very appreciative and grateful for the assistance and was provided a contact number if any additional issues arrive. No other inquires or request at this time.  No benefit exception needed at this time.   Elliot Cousin, RN Care Management Coordinator [facility] Main Office [phone]

## 2015-01-07 ENCOUNTER — Other Ambulatory Visit (HOSPITAL_COMMUNITY): Payer: Self-pay | Admitting: Physical Medicine and Rehabilitation

## 2015-01-07 DIAGNOSIS — M546 Pain in thoracic spine: Secondary | ICD-10-CM

## 2015-02-03 ENCOUNTER — Ambulatory Visit (HOSPITAL_COMMUNITY)
Admission: RE | Admit: 2015-02-03 | Discharge: 2015-02-03 | Disposition: A | Discharge status: Home / Self Care | Payer: 59 | Source: Ambulatory Visit | Attending: Physical Medicine and Rehabilitation | Admitting: Physical Medicine and Rehabilitation

## 2015-02-03 DIAGNOSIS — K228 Other specified diseases of esophagus: Secondary | ICD-10-CM | POA: Diagnosis not present

## 2015-02-03 DIAGNOSIS — M546 Pain in thoracic spine: Secondary | ICD-10-CM | POA: Diagnosis not present

## 2015-02-03 DIAGNOSIS — M899 Disorder of bone, unspecified: Secondary | ICD-10-CM | POA: Diagnosis not present

## 2015-02-03 DIAGNOSIS — M4804 Spinal stenosis, thoracic region: Secondary | ICD-10-CM | POA: Insufficient documentation

## 2015-02-03 DIAGNOSIS — M5124 Other intervertebral disc displacement, thoracic region: Secondary | ICD-10-CM | POA: Diagnosis not present

## 2015-02-04 ENCOUNTER — Other Ambulatory Visit (HOSPITAL_COMMUNITY): Payer: Self-pay | Admitting: Physical Medicine and Rehabilitation

## 2015-02-04 DIAGNOSIS — M899 Disorder of bone, unspecified: Secondary | ICD-10-CM

## 2015-02-04 MED FILL — AMITRIPTYLINE HCL 50 MG TAB: 50 | 90 days supply | Qty: 90 | Fill #1

## 2015-02-09 ENCOUNTER — Encounter (HOSPITAL_COMMUNITY)
Admission: RE | Admit: 2015-02-09 | Discharge: 2015-02-09 | Disposition: A | Discharge status: Home / Self Care | Payer: 59 | Source: Ambulatory Visit | Attending: Physical Medicine and Rehabilitation | Admitting: Physical Medicine and Rehabilitation

## 2015-02-09 DIAGNOSIS — M545 Low back pain: Secondary | ICD-10-CM | POA: Diagnosis not present

## 2015-02-09 DIAGNOSIS — M899 Disorder of bone, unspecified: Secondary | ICD-10-CM | POA: Diagnosis not present

## 2015-02-09 MED ORDER — TECHNETIUM TC 99M MEDRONATE IV KIT
25.0000 | PACK | Freq: Once | INTRAVENOUS | Status: AC | PRN
  Administered 2015-02-09: 25 via INTRAVENOUS

## 2015-02-15 DIAGNOSIS — M47814 Spondylosis without myelopathy or radiculopathy, thoracic region: Secondary | ICD-10-CM | POA: Diagnosis not present

## 2015-02-15 DIAGNOSIS — M47815 Spondylosis without myelopathy or radiculopathy, thoracolumbar region: Secondary | ICD-10-CM | POA: Diagnosis not present

## 2015-02-15 DIAGNOSIS — M546 Pain in thoracic spine: Secondary | ICD-10-CM | POA: Diagnosis not present

## 2015-02-24 DIAGNOSIS — M546 Pain in thoracic spine: Secondary | ICD-10-CM | POA: Diagnosis not present

## 2015-04-26 MED FILL — OMEPRAZOLE DR 20 MG CAPSULE: 20 | 90 days supply | Qty: 180 | Fill #1

## 2015-05-26 MED FILL — AMITRIPTYLINE HCL 50 MG TAB: 50 | 90 days supply | Qty: 90 | Fill #2

## 2015-06-02 DIAGNOSIS — M546 Pain in thoracic spine: Secondary | ICD-10-CM | POA: Diagnosis not present

## 2015-06-02 DIAGNOSIS — M47815 Spondylosis without myelopathy or radiculopathy, thoracolumbar region: Secondary | ICD-10-CM | POA: Diagnosis not present

## 2015-06-02 DIAGNOSIS — M47814 Spondylosis without myelopathy or radiculopathy, thoracic region: Secondary | ICD-10-CM | POA: Diagnosis not present

## 2015-06-23 MED FILL — MELOXICAM 15 MG TABLET: 15 | 60 days supply | Qty: 60 | Fill #0

## 2015-08-13 DIAGNOSIS — M546 Pain in thoracic spine: Secondary | ICD-10-CM | POA: Diagnosis not present

## 2015-08-13 DIAGNOSIS — M47814 Spondylosis without myelopathy or radiculopathy, thoracic region: Secondary | ICD-10-CM | POA: Diagnosis not present

## 2015-08-13 DIAGNOSIS — M47815 Spondylosis without myelopathy or radiculopathy, thoracolumbar region: Secondary | ICD-10-CM | POA: Diagnosis not present

## 2015-08-20 MED FILL — MELOXICAM 15 MG TABLET: 15 | 60 days supply | Qty: 60 | Fill #1

## 2015-08-20 MED FILL — AMITRIPTYLINE HCL 50 MG TAB: 50 | 90 days supply | Qty: 90 | Fill #3

## 2015-08-20 MED FILL — OMEPRAZOLE DR 20 MG CAPSULE: 20 | 90 days supply | Qty: 180 | Fill #0

## 2015-11-19 MED FILL — AMITRIPTYLINE HCL 50 MG TAB: 50 | 30 days supply | Qty: 30 | Fill #0

## 2015-11-25 MED FILL — OMEPRAZOLE DR 20 MG CAPSULE: 20 | 30 days supply | Qty: 60 | Fill #0

## 2015-12-09 DIAGNOSIS — K219 Gastro-esophageal reflux disease without esophagitis: Secondary | ICD-10-CM | POA: Diagnosis not present

## 2015-12-09 DIAGNOSIS — Z9884 Bariatric surgery status: Secondary | ICD-10-CM | POA: Diagnosis not present

## 2015-12-09 DIAGNOSIS — M545 Low back pain: Secondary | ICD-10-CM | POA: Diagnosis not present

## 2015-12-09 DIAGNOSIS — G4700 Insomnia, unspecified: Secondary | ICD-10-CM | POA: Diagnosis not present

## 2015-12-09 DIAGNOSIS — R030 Elevated blood-pressure reading, without diagnosis of hypertension: Secondary | ICD-10-CM | POA: Diagnosis not present

## 2015-12-09 DIAGNOSIS — E559 Vitamin D deficiency, unspecified: Secondary | ICD-10-CM | POA: Diagnosis not present

## 2015-12-21 MED FILL — AMITRIPTYLINE HCL 50 MG TAB: 50 | 90 days supply | Qty: 90 | Fill #0

## 2015-12-31 MED FILL — OMEPRAZOLE DR 20 MG CAPSULE: 20 | 90 days supply | Qty: 180 | Fill #0

## 2016-03-22 MED FILL — AMITRIPTYLINE HCL 50 MG TAB: 50 | 90 days supply | Qty: 90 | Fill #1

## 2016-04-04 ENCOUNTER — Telehealth (INDEPENDENT_AMBULATORY_CARE_PROVIDER_SITE_OTHER): Payer: Self-pay | Admitting: Radiology

## 2016-04-04 NOTE — Telephone Encounter (Signed)
Patient is requesting her records regarding her back.  Please email the release to her at Heidi Jordan.  Thanks-

## 2016-04-05 NOTE — Telephone Encounter (Signed)
Release form emailed as requested.

## 2016-04-06 MED FILL — OMEPRAZOLE DR 20 MG CAPSULE: 20 | 90 days supply | Qty: 180 | Fill #1

## 2016-05-10 DIAGNOSIS — M5414 Radiculopathy, thoracic region: Secondary | ICD-10-CM | POA: Diagnosis not present

## 2016-05-16 ENCOUNTER — Other Ambulatory Visit: Payer: Self-pay | Admitting: Obstetrics and Gynecology

## 2016-05-16 ENCOUNTER — Other Ambulatory Visit (HOSPITAL_COMMUNITY)
Admission: RE | Admit: 2016-05-16 | Discharge: 2016-05-16 | Disposition: A | Discharge status: Home / Self Care | Payer: 59 | Source: Ambulatory Visit | Attending: Obstetrics and Gynecology | Admitting: Obstetrics and Gynecology

## 2016-05-16 DIAGNOSIS — Z01419 Encounter for gynecological examination (general) (routine) without abnormal findings: Secondary | ICD-10-CM | POA: Diagnosis not present

## 2016-05-16 DIAGNOSIS — Z1151 Encounter for screening for human papillomavirus (HPV): Secondary | ICD-10-CM | POA: Insufficient documentation

## 2016-05-16 DIAGNOSIS — N951 Menopausal and female climacteric states: Secondary | ICD-10-CM | POA: Diagnosis not present

## 2016-05-18 LAB — CYTOLOGY - PAP
Diagnosis: NEGATIVE
HPV: NOT DETECTED

## 2016-06-13 DIAGNOSIS — J029 Acute pharyngitis, unspecified: Secondary | ICD-10-CM | POA: Diagnosis not present

## 2016-06-13 DIAGNOSIS — L299 Pruritus, unspecified: Secondary | ICD-10-CM | POA: Diagnosis not present

## 2016-06-19 ENCOUNTER — Other Ambulatory Visit: Payer: Self-pay | Admitting: Obstetrics and Gynecology

## 2016-06-19 DIAGNOSIS — Z1231 Encounter for screening mammogram for malignant neoplasm of breast: Secondary | ICD-10-CM

## 2016-06-28 MED FILL — AMITRIPTYLINE HCL 50 MG TAB: 50 | 90 days supply | Qty: 90 | Fill #2

## 2016-06-28 MED FILL — OMEPRAZOLE DR 20 MG CAPSULE: 20 | 90 days supply | Qty: 180 | Fill #0

## 2016-07-12 ENCOUNTER — Ambulatory Visit
Admission: RE | Admit: 2016-07-12 | Discharge: 2016-07-12 | Disposition: A | Discharge status: Home / Self Care | Payer: 59 | Source: Ambulatory Visit | Attending: Obstetrics and Gynecology | Admitting: Obstetrics and Gynecology

## 2016-07-12 DIAGNOSIS — Z1231 Encounter for screening mammogram for malignant neoplasm of breast: Secondary | ICD-10-CM

## 2016-09-25 MED FILL — OMEPRAZOLE 20 MG CAP: 20 | 90 days supply | Qty: 180 | Fill #0

## 2016-09-25 MED FILL — AMITRIPTYLINE HCL 50 MG TAB: 50 | 90 days supply | Qty: 90 | Fill #3

## 2016-11-02 DIAGNOSIS — Z4651 Encounter for fitting and adjustment of gastric lap band: Secondary | ICD-10-CM | POA: Diagnosis not present

## 2016-12-18 MED FILL — AMITRIPTYLINE HCL 50 MG TAB: 50 | 90 days supply | Qty: 90 | Fill #0

## 2017-01-05 MED FILL — OMEPRAZOLE 20 MG CAP: 20 | 90 days supply | Qty: 180 | Fill #0

## 2017-03-06 ENCOUNTER — Other Ambulatory Visit (INDEPENDENT_AMBULATORY_CARE_PROVIDER_SITE_OTHER): Payer: Self-pay | Admitting: Specialist

## 2017-03-06 ENCOUNTER — Telehealth (INDEPENDENT_AMBULATORY_CARE_PROVIDER_SITE_OTHER): Payer: Self-pay | Admitting: Radiology

## 2017-03-06 MED ORDER — TRAMADOL HCL 50 MG PO TABS: 50.0000 mg | ORAL_TABLET | Freq: Four times a day (QID) | ORAL | 0 refills | Status: DC | PRN

## 2017-03-06 MED FILL — traMADol HCL 50 MG TABS: 50 | 8 days supply | Qty: 30 | Fill #0

## 2017-03-06 NOTE — Telephone Encounter (Signed)
Patient called and said that she is still having back pain.  The first injection that Dr Newton did helped for a few months, but the next two injections did nothing.  She has been taking tylenol and ibuprofen but it is not helping.  She is wondering if Dr Nitka can call her something in for the pain?  She uses [facility], [address]  

## 2017-03-06 NOTE — Telephone Encounter (Signed)
Patient called and said that she is still having back pain.  The first injection that Dr Alvester MorinNewton did helped for a few months, but the next two injections did nothing.  She has been taking tylenol and ibuprofen but it is not helping.  She is wondering if Dr Otelia SergeantNitka can call her something in for the pain?  She uses [facility], [address]

## 2017-03-07 NOTE — Telephone Encounter (Signed)
Tramadol Rx approved, in Epic please call to her pharmacy if not already done. Thank you. Candise Bowensjen

## 2017-03-08 NOTE — Telephone Encounter (Signed)
done

## 2017-03-15 MED FILL — AMITRIPTYLINE HCL 50 MG TAB: 50 | 30 days supply | Qty: 30 | Fill #0

## 2017-03-26 DIAGNOSIS — M5414 Radiculopathy, thoracic region: Secondary | ICD-10-CM | POA: Diagnosis not present

## 2017-04-05 DIAGNOSIS — Z9884 Bariatric surgery status: Secondary | ICD-10-CM | POA: Diagnosis not present

## 2017-04-12 ENCOUNTER — Ambulatory Visit: Payer: Self-pay | Admitting: Nurse Practitioner

## 2017-04-12 VITALS — BP 125/90 | HR 100 | Temp 98.5°F | Resp 16 | Wt 202.8 lb

## 2017-04-12 DIAGNOSIS — R300 Dysuria: Secondary | ICD-10-CM

## 2017-04-12 DIAGNOSIS — N3001 Acute cystitis with hematuria: Secondary | ICD-10-CM

## 2017-04-12 LAB — POCT URINALYSIS DIPSTICK
Glucose, UA: NEGATIVE
NITRITE UA: NEGATIVE
PH UA: 5 (ref 5.0–8.0)
SPEC GRAV UA: 1.015 (ref 1.010–1.025)
Urobilinogen, UA: 0.2 E.U./dL

## 2017-04-12 MED ORDER — CIPROFLOXACIN HCL 500 MG PO TABS: 500.0000 mg | ORAL_TABLET | Freq: Two times a day (BID) | ORAL | 0 refills | Status: AC

## 2017-04-12 MED FILL — CIPROFLOXACIN HCL 500 MG TA: 500 | 5 days supply | Qty: 10 | Fill #0

## 2017-04-12 NOTE — Patient Instructions (Signed)

## 2017-04-12 NOTE — Progress Notes (Signed)
Subjective:    Heidi Jordan is a 52 y.o. female who complains of dysuria, frequency and urgency for 1 week.  Patient also complains of back pain. Patient denies No discharge,  No foul ordor , no hematuria.  Patient does have a history of recurrent UTI.  Patient does not have a history of pyelonephritis.history of bulging disc thoracic back area, new is mild lower back pain.   The following portions of the patient's history were reviewed and updated as appropriate: allergies, current medications, past medical history and past social history. Review of Systems Constitutional: negative Respiratory: negative Cardiovascular: negative Gastrointestinal: positive for reflux symptoms Genitourinary:positive for for UTI 2 years ago    Objective:    BP 125/90 (BP Location: Right Arm, Patient Position: Sitting, Cuff Size: Normal)   Pulse 100   Temp 98.5 F (36.9 C) (Oral)   Resp 16   Wt 202 lb 12.8 oz (92 kg)   LMP 05/15/2011   SpO2 98%   BMI 35.92 kg/m  General: alert, cooperative and moderately obese  Abdomen: soft, non-tender, without masses or organomegaly, soft, nondistended, normal bowel sounds, nontender, without guarding and no hepatosplenomegaly in the entire abdomen and nontender  Back: CVA tenderness absent  GU: defer exam   Laboratory:  Urine dipstick shows sp gravity 1.015 , negative for glucose, 3+ for hemoglobin, trace for ketones, 3+ for leukocyte esterase, negative for nitrites, trace for protein and 0.2 for urobilinogen.     Assessment:    UTI    Plan: Plan:    1. Medications: ciprofloxacin, OTC AZO take as directed x 2 days , increase water intake. 2. Maintain adequate hydration 3. Follow up if symptoms not improving, and prn.   4.  Patient verbalizes understanding and has no questions at discharge. Meds ordered this encounter  Medications  . ciprofloxacin (CIPRO) 500 MG tablet    Sig: Take 1 tablet (500 mg total) by mouth 2 (two) times daily for 5 days.   Dispense:  10 tablet    Refill:  0

## 2017-04-16 ENCOUNTER — Telehealth: Payer: Self-pay

## 2017-04-16 NOTE — Telephone Encounter (Signed)
Called to follow up with pt and she states that she is doing much better.

## 2017-04-20 DIAGNOSIS — G4700 Insomnia, unspecified: Secondary | ICD-10-CM | POA: Diagnosis not present

## 2017-04-20 DIAGNOSIS — M546 Pain in thoracic spine: Secondary | ICD-10-CM | POA: Diagnosis not present

## 2017-04-20 DIAGNOSIS — E611 Iron deficiency: Secondary | ICD-10-CM | POA: Diagnosis not present

## 2017-04-20 DIAGNOSIS — K219 Gastro-esophageal reflux disease without esophagitis: Secondary | ICD-10-CM | POA: Diagnosis not present

## 2017-04-20 DIAGNOSIS — R252 Cramp and spasm: Secondary | ICD-10-CM | POA: Diagnosis not present

## 2017-04-20 DIAGNOSIS — Z1211 Encounter for screening for malignant neoplasm of colon: Secondary | ICD-10-CM | POA: Diagnosis not present

## 2017-04-20 MED FILL — OMEPRAZOLE 20 MG CAP: 20 | 90 days supply | Qty: 180 | Fill #0

## 2017-04-20 MED FILL — AMITRIPTYLINE HCL 50 MG TAB: 50 | 90 days supply | Qty: 90 | Fill #0

## 2017-04-26 DIAGNOSIS — M5414 Radiculopathy, thoracic region: Secondary | ICD-10-CM | POA: Diagnosis not present

## 2017-05-08 DIAGNOSIS — Z1211 Encounter for screening for malignant neoplasm of colon: Secondary | ICD-10-CM | POA: Diagnosis not present

## 2017-05-16 ENCOUNTER — Encounter: Payer: Self-pay | Admitting: Internal Medicine

## 2017-05-22 ENCOUNTER — Encounter: Payer: Self-pay | Admitting: *Deleted

## 2017-06-19 ENCOUNTER — Ambulatory Visit (INDEPENDENT_AMBULATORY_CARE_PROVIDER_SITE_OTHER): Payer: 59 | Admitting: Internal Medicine

## 2017-06-19 ENCOUNTER — Encounter: Payer: Self-pay | Admitting: Internal Medicine

## 2017-06-19 ENCOUNTER — Encounter

## 2017-06-19 VITALS — BP 130/80 | HR 76 | Ht 63.0 in | Wt 204.2 lb

## 2017-06-19 DIAGNOSIS — R195 Other fecal abnormalities: Secondary | ICD-10-CM | POA: Diagnosis not present

## 2017-06-19 DIAGNOSIS — D509 Iron deficiency anemia, unspecified: Secondary | ICD-10-CM | POA: Diagnosis not present

## 2017-06-19 DIAGNOSIS — Z800 Family history of malignant neoplasm of digestive organs: Secondary | ICD-10-CM

## 2017-06-19 MED ORDER — SUPREP BOWEL PREP KIT 17.5-3.13-1.6 GM/177ML PO SOLN: 1.0000 | ORAL | 0 refills | Status: DC

## 2017-06-19 MED FILL — SUPREP BOWEL PREP KIT: 17.5-3.13-1 | 1 days supply | Qty: 354 | Fill #0

## 2017-06-19 NOTE — Patient Instructions (Addendum)
You have been scheduled for an endoscopy and colonoscopy. Please follow written instructions given to you at your visit today.  Please pick up your prep supplies at the pharmacy within the next 1-3 days. If you use inhalers (even only as needed), please bring them with you on the day of your procedure. Your physician has requested that you go to www.startemmi.com and enter the access code given to you at your visit today. This web site gives a general overview about your procedure. However, you should still follow specific instructions given to you by our office regarding your preparation for the procedure.  Hold your iron supplement 7 days prior to your procedure.  If you are age 19 or older, your body mass index should be between 23-30. Your Body mass index is 36.17 kg/m. If this is out of the aforementioned range listed, please consider follow up with your Primary Care Provider.  If you are age 55 or younger, your body mass index should be between 19-25. Your Body mass index is 36.17 kg/m. If this is out of the aformentioned range listed, please consider follow up with your Primary Care Provider.

## 2017-06-20 ENCOUNTER — Encounter: Payer: Self-pay | Admitting: Internal Medicine

## 2017-06-20 NOTE — Progress Notes (Signed)
HPI: Heidi Jordan is a 52 year old female with a past medical history of iron deficiency anemia, GERD, headaches, obesity status post gastric lap band placed in [place] around 2004 who is seen in consultation at the request of Dr. Azucena Cecil to evaluate iron deficiency anemia and positive FIT.  She is here alone today.  She reports she has had intermittent issues with her gastric lap band.  The cough on her band is currently completely deflated.  When it is inflated she has frequent issues with vomiting.  She has had the cough deflated 3 times due to issues with upper abdominal pain and vomiting.  She has gained weight however and is somewhat concerned about this.  She considered converting the LAP-BAND to Roux-en-Y gastric bypass or gastric sleeve procedure but reportedly this was not going to be covered by her insurance.  With the band down she does have intermittent issues with heartburn but she is taking omeprazole 40 mg day.  This works well for her.  Currently she denies dysphagia and odynophagia.  She reports a long history of constipation for which she uses MiraLAX 17 g daily.  This works well for her constipation.  She is taking oral iron twice daily which causes her stools to be dark.  No visible blood or melena.  With her iron deficiency it was felt secondary to menstrual bleeding and she had a uterine ablation 3 years ago.  Since this time she has had no further menstrual periods but has remained persistently iron deficient.  She recently had a FIT test by primary care which was positive.  Her mother had colon cancer, her father had esophagus cancer and also lymphoma.  She had a colonoscopy performed by Dr. Dulce Sellar in 2013.  I do not have this report today.  She had blood work in March 2018 with primary care and had a hemoglobin of 10.4, MCV 72, platelet count normal and white count 3.8.  Her ferritin was low at 4.1, transferrin elevated at 371  Past Medical History:  Diagnosis Date  . Anemia    . Chronic back pain    tx with ibuprofen  . Chronic tension headaches   . GERD (gastroesophageal reflux disease)   . Insomnia    tx with amitriptyline  . Iron deficiency   . Laryngopharyngeal reflux   . Missed abortion    no surgery reuqired  . Obesity     Past Surgical History:  Procedure Laterality Date  . CESAREAN SECTION     x 2  . COLONOSCOPY    . DIAGNOSTIC LAPAROSCOPY     x 2 surg,for ectopic preg w/ removal of tube  . DIAGNOSTIC LAPAROSCOPY     endometriosis - removed tube  . fibroid tumor removal    . HYSTEROSCOPY WITH THERMACHOICE  06/14/2011   Procedure: HYSTEROSCOPY WITH THERMACHOICE;  Surgeon: Geryl Rankins, MD;  Location: [facility];  Service: Gynecology;;  Therma choice started at 1413  . Lap Band surgery  2004  . UPPER GASTROINTESTINAL ENDOSCOPY    . WISDOM TOOTH EXTRACTION      Outpatient Medications Prior to Visit  Medication Sig Dispense Refill  . amitriptyline (ELAVIL) 50 MG tablet Take 50 mg by mouth at bedtime.    Marland Kitchen aspirin 325 MG tablet Take 325 mg by mouth daily.    Marland Kitchen omeprazole (PRILOSEC) 40 MG capsule Take 40 mg by mouth daily.    . traMADol (ULTRAM) 50 MG tablet Take 1 tablet (50 mg total) by mouth every 6 (six)  hours as needed. 30 tablet 0   No facility-administered medications prior to visit.     Allergies  Allergen Reactions  . Sulfa Drugs Cross Reactors Other (See Comments)    casues migraine headaches    Family History  Problem Relation Age of Onset  . Breast cancer Maternal Grandmother   . Colon cancer Mother        in her 91s  . Renal cancer Mother   . Colon polyps Mother   . Hypertension Father   . Skin cancer Father   . Lymphoma Father   . Esophageal cancer Father   . Stomach cancer Maternal Grandfather   . Brain cancer Paternal Grandmother   . Esophageal cancer Paternal Grandfather     Social History   Tobacco Use  . Smoking status: Never Smoker  . Smokeless tobacco: Never Used  Substance Use Topics  . Alcohol use:  Yes    Comment: socially  . Drug use: No    ROS: As per history of present illness, otherwise negative  BP 130/80   Pulse 76   Ht  (1.6 m)   Wt 204 lb 3.2 oz (92.6 kg)   LMP 05/15/2011   BMI 36.17 kg/m  Constitutional: Well-developed and well-nourished. No distress. HEENT: Normocephalic and atraumatic. Oropharynx is clear and moist. Conjunctivae are normal.  No scleral icterus. Neck: Neck supple. Trachea midline. Cardiovascular: Normal rate, regular rhythm and intact distal pulses. No M/R/G Pulmonary/chest: Effort normal and breath sounds normal. No wheezing, rales or rhonchi. Abdominal: Soft, nontender, nondistended. Bowel sounds active throughout. There are no masses palpable. No hepatosplenomegaly. Extremities: no clubbing, cyanosis, or edema Neurological: Alert and oriented to person place and time. Skin: Skin is warm and dry.  Psychiatric: Normal mood and affect. Behavior is normal.  RELEVANT LABS AND IMAGING: See HPI  ASSESSMENT/PLAN:  52 year old female with a past medical history of iron deficiency anemia, GERD, headaches, obesity status post gastric lap band placed in [place] around 2004 who is seen in consultation at the request of Dr. Azucena Cecil to evaluate iron deficiency anemia and positive FIT.   1. IDA/+FIT/family history of colon cancer/family history of esophagus cancer --upper endoscopy and colonoscopy recommended to evaluate her iron deficiency and also based on her family history.  We discussed the risk, benefits and alternatives and she is agreeable and wishes to proceed.  2.  GERD -currently well controlled on omeprazole 40 mg daily.  Continue current dose.  3.  LAP-BAND --in place with cuff deflated.  She has considerable issues with vomiting and also swallowing dysfunction when the cuff is inflated.  We will be performing upper endoscopy as discussed above.    ZO:XWRUEA, Onalee Hua, Md [address] [place], [place] [address]

## 2017-07-10 ENCOUNTER — Encounter: Payer: Self-pay | Admitting: Internal Medicine

## 2017-07-16 MED FILL — AMITRIPTYLINE HCL 50 MG TAB: 50 | 90 days supply | Qty: 90 | Fill #1

## 2017-07-18 ENCOUNTER — Encounter: Payer: Self-pay | Admitting: Internal Medicine

## 2017-07-18 ENCOUNTER — Ambulatory Visit (AMBULATORY_SURGERY_CENTER): Payer: 59 | Admitting: Internal Medicine

## 2017-07-18 ENCOUNTER — Other Ambulatory Visit (INDEPENDENT_AMBULATORY_CARE_PROVIDER_SITE_OTHER): Payer: 59

## 2017-07-18 ENCOUNTER — Other Ambulatory Visit: Payer: Self-pay

## 2017-07-18 VITALS — BP 139/98 | HR 81 | Temp 98.4°F | Resp 16 | Ht 63.0 in | Wt 204.0 lb

## 2017-07-18 DIAGNOSIS — K219 Gastro-esophageal reflux disease without esophagitis: Secondary | ICD-10-CM | POA: Diagnosis not present

## 2017-07-18 DIAGNOSIS — D509 Iron deficiency anemia, unspecified: Secondary | ICD-10-CM

## 2017-07-18 DIAGNOSIS — F329 Major depressive disorder, single episode, unspecified: Secondary | ICD-10-CM | POA: Diagnosis not present

## 2017-07-18 DIAGNOSIS — Z800 Family history of malignant neoplasm of digestive organs: Secondary | ICD-10-CM

## 2017-07-18 DIAGNOSIS — R195 Other fecal abnormalities: Secondary | ICD-10-CM | POA: Diagnosis present

## 2017-07-18 DIAGNOSIS — K319 Disease of stomach and duodenum, unspecified: Secondary | ICD-10-CM | POA: Diagnosis not present

## 2017-07-18 LAB — CBC WITH DIFFERENTIAL/PLATELET
BASOS ABS: 0 10*3/uL (ref 0.0–0.1)
BASOS PCT: 0.7 % (ref 0.0–3.0)
EOS ABS: 0 10*3/uL (ref 0.0–0.7)
Eosinophils Relative: 0.8 % (ref 0.0–5.0)
HCT: 38.5 % (ref 36.0–46.0)
Hemoglobin: 12.9 g/dL (ref 12.0–15.0)
Lymphocytes Relative: 29.4 % (ref 12.0–46.0)
Lymphs Abs: 1.5 10*3/uL (ref 0.7–4.0)
MCHC: 33.6 g/dL (ref 30.0–36.0)
MCV: 81.8 fl (ref 78.0–100.0)
MONO ABS: 0.4 10*3/uL (ref 0.1–1.0)
Monocytes Relative: 7.3 % (ref 3.0–12.0)
Neutro Abs: 3.2 10*3/uL (ref 1.4–7.7)
Neutrophils Relative %: 61.8 % (ref 43.0–77.0)
PLATELETS: 277 10*3/uL (ref 150.0–400.0)
RBC: 4.7 Mil/uL (ref 3.87–5.11)
RDW: 17.5 % — AB (ref 11.5–15.5)
WBC: 5.1 10*3/uL (ref 4.0–10.5)

## 2017-07-18 LAB — IBC PANEL
IRON: 62 ug/dL (ref 42–145)
SATURATION RATIOS: 14.3 % — AB (ref 20.0–50.0)
TRANSFERRIN: 309 mg/dL (ref 212.0–360.0)

## 2017-07-18 MED ORDER — SODIUM CHLORIDE 0.9 % IV SOLN: 500.0000 mL | Freq: Once | INTRAVENOUS | Status: DC

## 2017-07-18 NOTE — Progress Notes (Signed)
Report to PACU, RN, vss, BBS= Clear.  

## 2017-07-18 NOTE — Op Note (Signed)
[facility] Patient Name: Heidi Jordan Procedure Date: 07/18/2017 2:33 PM MRN: [id number] Endoscopist: Beverley Fiedler , MD Age: 52 Referring MD:  Date of Birth: 03/01/1965 Gender: Female Account #: [id number] Procedure:                Colonoscopy Indications:              Iron deficiency anemia, Family history of colon                            cancer in a first-degree relative, Positive fecal                            immunochemical test Medicines:                Monitored Anesthesia Care Procedure:                Pre-Anesthesia Assessment:                           - Prior to the procedure, a History and Physical                            was performed, and patient medications and                            allergies were reviewed. The patient's tolerance of                            previous anesthesia was also reviewed. The risks                            and benefits of the procedure and the sedation                            options and risks were discussed with the patient.                            All questions were answered, and informed consent                            was obtained. Prior Anticoagulants: The patient has                            taken no previous anticoagulant or antiplatelet                            agents. ASA Grade Assessment: II - A patient with                            mild systemic disease. After reviewing the risks                            and benefits, the patient was deemed in  satisfactory condition to undergo the procedure.                           After obtaining informed consent, the colonoscope                            was passed under direct vision. Throughout the                            procedure, the patient's blood pressure, pulse, and                            oxygen saturations were monitored continuously. The                            Colonoscope was introduced through the anus  and                            advanced to the terminal ileum. The colonoscopy was                            performed without difficulty. The patient tolerated                            the procedure well. The quality of the bowel                            preparation was good. The terminal ileum, ileocecal                            valve, appendiceal orifice, and rectum were                            photographed. Scope In: 2:51:24 PM Scope Out: 3:03:10 PM Scope Withdrawal Time: 0 hours 7 minutes 57 seconds  Total Procedure Duration: 0 hours 11 minutes 46 seconds  Findings:                 Hemorrhoids were found on perianal exam.                           The terminal ileum appeared normal.                           The colon (entire examined portion) appeared normal.                           External and internal hemorrhoids were found during                            retroflexion and during perianal exam. The                            hemorrhoids were medium-sized. Complications:            No immediate complications. Estimated Blood  Loss:     Estimated blood loss: none. Impression:               - The examined portion of the ileum was normal.                           - The entire examined colon is normal.                           - External and internal hemorrhoids.                           - No specimens collected. Recommendation:           - Patient has a contact number available for                            emergencies. The signs and symptoms of potential                            delayed complications were discussed with the                            patient. Return to normal activities tomorrow.                            Written discharge instructions were provided to the                            patient.                           - Resume previous diet.                           - Continue present medications.                           - Repeat colonoscopy  in 5 years for screening                            purposes.                           - Replace iron either oral or IV. Follow iron                            studies to ensure normalization. Beverley FiedlerJay M Ambree Frances, MD 07/18/2017 3:15:17 PM This report has been signed electronically.

## 2017-07-18 NOTE — Op Note (Signed)
[facility] Patient Name: Heidi RocksKimberly Mirelez Procedure Date: 07/18/2017 2:33 PM MRN: [id number] Endoscopist: Beverley FiedlerJay M Riddik Senna , MD Age: [age] Referring MD:  Date of Birth: 12-25-1965 Gender: Female Account #: [id number] Procedure:                Upper GI endoscopy Indications:              Iron deficiency anemia, Gastro-esophageal reflux                            disease, Family history of esophageal cancer Medicines:                Monitored Anesthesia Care Procedure:                Pre-Anesthesia Assessment:                           - Prior to the procedure, a History and Physical                            was performed, and patient medications and                            allergies were reviewed. The patient's tolerance of                            previous anesthesia was also reviewed. The risks                            and benefits of the procedure and the sedation                            options and risks were discussed with the patient.                            All questions were answered, and informed consent                            was obtained. Prior Anticoagulants: The patient has                            taken no previous anticoagulant or antiplatelet                            agents. ASA Grade Assessment: II - A patient with                            mild systemic disease. After reviewing the risks                            and benefits, the patient was deemed in                            satisfactory condition to undergo the procedure.  After obtaining informed consent, the endoscope was                            passed under direct vision. Throughout the                            procedure, the patient's blood pressure, pulse, and                            oxygen saturations were monitored continuously. The                            Model GIF-[id number]) scope was introduced                            through the  mouth, and advanced to the second part                            of duodenum. The upper GI endoscopy was                            accomplished without difficulty. The patient                            tolerated the procedure well. Scope In: Scope Out: Findings:                 Normal mucosa was found in the entire esophagus.                           The Z-line was slightly irregular and was found 35                            cm from the incisors. This was biopsied with a cold                            forceps for evaluation to rule out Barrett's                            Esophagus.                           A 4 cm hiatal hernia was present.                           Evidence of an adjustable gastric banding was found                            in the gastric fundus. This was characterized by an                            intact appearance.                           Normal mucosa was found in  the entire examined                            stomach. Biopsies were taken with a cold forceps                            for histology and Helicobacter pylori testing.                           The examined duodenum was normal. Biopsies for                            histology were taken with a cold forceps for                            evaluation of celiac disease. Complications:            No immediate complications. Estimated Blood Loss:     Estimated blood loss was minimal. Impression:               - Normal mucosa was found in the entire esophagus.                           - Z-line irregular, 35 cm from the incisors.                            Biopsied.                           - 4 cm hiatal hernia.                           - An adjustable gastric banding was found,                            characterized by an intact appearance.                           - Normal mucosa was found in the entire stomach.                            Biopsied.                           - Normal  examined duodenum. Biopsied. Recommendation:           - Patient has a contact number available for                            emergencies. The signs and symptoms of potential                            delayed complications were discussed with the                            patient. Return to normal activities tomorrow.  Written discharge instructions were provided to the                            patient.                           - Resume previous diet.                           - Continue present medications.                           - Await pathology results. Beverley Fiedler, MD 07/18/2017 3:09:13 PM This report has been signed electronically.

## 2017-07-18 NOTE — Patient Instructions (Signed)
Please read handout on hiatal hernia and hemorrhoids.     YOU HAD AN ENDOSCOPIC PROCEDURE TODAY AT THE [facility]:   Refer to the procedure report that was given to you for any specific questions about what was found during the examination.  If the procedure report does not answer your questions, please call your gastroenterologist to clarify.  If you requested that your care partner not be given the details of your procedure findings, then the procedure report has been included in a sealed envelope for you to review at your convenience later.  YOU SHOULD EXPECT: Some feelings of bloating in the abdomen. Passage of more gas than usual.  Walking can help get rid of the air that was put into your GI tract during the procedure and reduce the bloating. If you had a lower endoscopy (such as a colonoscopy or flexible sigmoidoscopy) you may notice spotting of blood in your stool or on the toilet paper. If you underwent a bowel prep for your procedure, you may not have a normal bowel movement for a few days.  Please Note:  You might notice some irritation and congestion in your nose or some drainage.  This is from the oxygen used during your procedure.  There is no need for concern and it should clear up in a day or so.  SYMPTOMS TO REPORT IMMEDIATELY:   Following lower endoscopy (colonoscopy or flexible sigmoidoscopy):  Excessive amounts of blood in the stool  Significant tenderness or worsening of abdominal pains  Swelling of the abdomen that is new, acute  Fever of 100F or higher   Following upper endoscopy (EGD)  Vomiting of blood or coffee ground material  New chest pain or pain under the shoulder blades  Painful or persistently difficult swallowing  New shortness of breath  Fever of 100F or higher  Black, tarry-looking stools  For urgent or emergent issues, a gastroenterologist can be reached at any hour by calling [phone].   DIET:  We do recommend a small meal at  first, but then you may proceed to your regular diet.  Drink plenty of fluids but you should avoid alcoholic beverages for 24 hours.  ACTIVITY:  You should plan to take it easy for the rest of today and you should NOT DRIVE or use heavy machinery until tomorrow (because of the sedation medicines used during the test).    FOLLOW UP: Our staff will call the number listed on your records the next business day following your procedure to check on you and address any questions or concerns that you may have regarding the information given to you following your procedure. If we do not reach you, we will leave a message.  However, if you are feeling well and you are not experiencing any problems, there is no need to return our call.  We will assume that you have returned to your regular daily activities without incident.  If any biopsies were taken you will be contacted by phone or by letter within the next 1-3 weeks.  Please call us at [phone] if you have not heard about the biopsies in 3 weeks.    SIGNATURES/CONFIDENTIALITY: You and/or your care partner have signed paperwork which will be entered into your electronic medical record.  These signatures attest to the fact that that the information above on your After Visit Summary has been reviewed and is understood.  Full responsibility of the confidentiality of this discharge information lies with you and/or your care-partner.

## 2017-07-18 NOTE — Progress Notes (Signed)
Called to room to assist during endoscopic procedure.  Patient ID and intended procedure confirmed with present staff. Received instructions for my participation in the procedure from the performing physician.  

## 2017-07-19 ENCOUNTER — Telehealth: Payer: Self-pay

## 2017-07-19 LAB — FERRITIN: Ferritin: 13.8 ng/mL (ref 10.0–291.0)

## 2017-07-19 NOTE — Telephone Encounter (Signed)
  Follow up Call-  Call back number 07/18/2017  Post procedure Call Back phone  # [phone]  Permission to leave phone message Yes  Some recent data might be hidden     Patient questions:  Do you have a fever, pain , or abdominal swelling? No. Pain Score  0 *  Have you tolerated food without any problems? Yes.    Have you been able to return to your normal activities? Yes.    Do you have any questions about your discharge instructions: Diet   No. Medications  No. Follow up visit  No.  Do you have questions or concerns about your Care? No.  Actions: * If pain score is 4 or above: No action needed, pain <4.

## 2017-07-19 NOTE — Telephone Encounter (Signed)
Attempted to reach pt. With follow-up call following endoscopic procedure 07/18/2017.  Tried the no. Given in admitting twice, wrong no.  Called home no. And spoke to pt.'s husband.  Will try to reach pt. Later today.

## 2017-07-24 ENCOUNTER — Encounter: Payer: Self-pay | Admitting: Internal Medicine

## 2017-08-03 MED FILL — OMEPRAZOLE 20 MG CAP: 20 | 90 days supply | Qty: 180 | Fill #1

## 2017-08-14 DIAGNOSIS — Z4651 Encounter for fitting and adjustment of gastric lap band: Secondary | ICD-10-CM | POA: Diagnosis not present

## 2017-08-17 ENCOUNTER — Ambulatory Visit (INDEPENDENT_AMBULATORY_CARE_PROVIDER_SITE_OTHER): Payer: Self-pay

## 2017-08-17 ENCOUNTER — Ambulatory Visit (INDEPENDENT_AMBULATORY_CARE_PROVIDER_SITE_OTHER): Payer: 59 | Admitting: Specialist

## 2017-08-17 ENCOUNTER — Encounter (INDEPENDENT_AMBULATORY_CARE_PROVIDER_SITE_OTHER): Payer: Self-pay | Admitting: Specialist

## 2017-08-17 VITALS — BP 133/88 | HR 96 | Ht 63.0 in | Wt 190.0 lb

## 2017-08-17 DIAGNOSIS — M5134 Other intervertebral disc degeneration, thoracic region: Secondary | ICD-10-CM | POA: Diagnosis not present

## 2017-08-17 DIAGNOSIS — M549 Dorsalgia, unspecified: Secondary | ICD-10-CM

## 2017-08-17 MED ORDER — TRAMADOL HCL 50 MG PO TABS: 50.0000 mg | ORAL_TABLET | Freq: Four times a day (QID) | ORAL | 0 refills | Status: AC | PRN

## 2017-08-17 NOTE — Patient Instructions (Signed)
Avoid frequent bending and stooping  No lifting greater than 10 lbs. May use ice or moist heat for pain. Weight loss is of benefit. A champion work belt for use with physical activity. Start exercise program, be careful to avoid flexion of the mid back area.  Tramadol CBD oil amazon tablet Solan paz  Extension exercises of the thoracolumbar spine.

## 2017-08-17 NOTE — Progress Notes (Signed)
Office Visit Note   Patient: Heidi Jordan           Date of Birth: 16-Nov-1965           MRN: [id number] Visit Date: 08/17/2017              Requested by: Tally Joe, MD [id number] [place], [place] [address] PCP: Tally Joe, MD   Assessment & Plan: Visit Diagnoses:  1. Mid back pain     Plan:Avoid frequent bending and stooping  No lifting greater than 10 lbs. May use ice or moist heat for pain. Weight loss is of benefit. A champion work belt for use with physical activity. Start exercise program, be careful to avoid flexion of the mid back area.  Tramadol CBD oil amazon tablet Solan paz  Extension exercises of the thoracolumbar spine.  Follow-Up Instructions: No follow-ups on file.   Orders:  Orders Placed This Encounter  Procedures  . XR Thoracic Spine 2 View   No orders of the defined types were placed in this encounter.     Procedures: No procedures performed   Clinical Data: No additional findings.   Subjective: Chief Complaint  Patient presents with  . Middle Back - Pain    52 year old female right handed female with a nearly 5 year history of right posterior mid to lower thoracic pain. She has been seen by Dr. Alvester Morin and has had facet blocks and seen myself and Dr. Danielle Dess for evaluation. Pain is associated with activity, she notices the pain in the right lower thorax paravertebral, about 4-5 cm to the right of the midline. No leg pain, numbness or paresthesias. No bowel or bladder difficulty. She had a significant episode of bilateral upper extremity herpes zoster or shingles. That occurred in 2014 and she was seen by Dr. Prince Rome, and then  Began with right lower thorax pain. Has had injections by Dr. Alvester Morin right T11-12 and T12-L1 without significant improvement, also has seen Dr. Ollen Bowl and had further facet injections in the more proximally but this did not help either. The MRI from 01/2015 did show a 1 inch right posterior  hepatic lobe cystic structure. She has a significant family history of GI cancer, mother with colon cancer, she is deceased due to pulmonary disease and lung ca and end stage pulmonary disease.    Review of Systems  Constitutional: Positive for activity change and unexpected weight change. Negative for appetite change, chills and diaphoresis.  HENT: Negative.  Negative for congestion, dental problem, drooling, ear discharge, ear pain and mouth sores.   Eyes: Negative.  Negative for photophobia, pain, discharge, redness, itching and visual disturbance.  Respiratory: Positive for cough and choking. Negative for apnea, chest tightness, shortness of breath, wheezing and stridor.   Cardiovascular: Negative.  Negative for chest pain, palpitations and leg swelling.  Gastrointestinal: Positive for abdominal distention and blood in stool. Negative for abdominal pain, anal bleeding, constipation and diarrhea.  Endocrine: Negative.  Negative for cold intolerance, heat intolerance, polydipsia and polyphagia.  Genitourinary: Negative.  Negative for difficulty urinating, dyspareunia, dysuria, enuresis, flank pain, frequency, genital sores, hematuria, menstrual problem and pelvic pain.  Musculoskeletal: Positive for back pain. Negative for arthralgias, gait problem, joint swelling, myalgias, neck pain and neck stiffness.  Skin: Negative.  Negative for color change, pallor, rash and wound.  Allergic/Immunologic: Negative.  Negative for environmental allergies, food allergies and immunocompromised state.  Neurological: Negative.  Negative for dizziness, tremors, seizures, syncope, facial asymmetry,  speech difficulty, weakness, light-headedness, numbness and headaches.  Hematological: Negative.  Negative for adenopathy. Does not bruise/bleed easily.  Psychiatric/Behavioral: Negative.  Negative for agitation, behavioral problems, confusion, decreased concentration, dysphoric mood, hallucinations, self-injury, sleep  disturbance and suicidal ideas. The patient is not nervous/anxious and is not hyperactive.      Objective: Vital Signs: BP 133/88   Pulse 96   Ht 5\' 3"  (1.6 m)   Wt 190 lb (86.2 kg)   LMP 05/15/2011   BMI 33.66 kg/m   Physical Exam  Constitutional: She is oriented to person, place, and time. She appears well-developed and well-nourished.  HENT:  Head: Normocephalic and atraumatic.  Eyes: Pupils are equal, round, and reactive to light. EOM are normal.  Neck: Normal range of motion. Neck supple.  Pulmonary/Chest: Effort normal and breath sounds normal.  Abdominal: Soft. Bowel sounds are normal.  Neurological: She is alert and oriented to person, place, and time.  Skin: Skin is warm and dry.  Psychiatric: She has a normal mood and affect. Her behavior is normal. Judgment and thought content normal.    Back Exam   Tenderness  The patient is experiencing tenderness in the lumbar.  Range of Motion  Extension: normal  Flexion: normal  Lateral bend right: normal  Lateral bend left: normal  Rotation right: normal  Rotation left: normal   Muscle Strength  Right Quadriceps:  5/5  Left Quadriceps:  5/5  Right Hamstrings:  5/5  Left Hamstrings:  5/5   Tests  Straight leg raise right: negative Straight leg raise left: negative  Reflexes  Patellar: normal Achilles: normal Babinski's sign: normal   Other  Toe walk: normal Heel walk: normal Sensation: normal Gait: normal  Erythema: no back redness  Comments:  No clonus or spasticity, no increased tone in the legs. Pain in right lower thoracic, With marker showing the maximum area to the right at the T11-12 area consistent with the T11-212 thoracic DD.       Specialty Comments:  No specialty comments available.  Imaging: No results found.   PMFS History: There are no active problems to display for this patient.  Past Medical History:  Diagnosis Date  . Anemia   . Chronic back pain    tx with ibuprofen    . Chronic tension headaches   . GERD (gastroesophageal reflux disease)   . Insomnia    tx with amitriptyline  . Iron deficiency   . Laryngopharyngeal reflux   . Missed abortion    no surgery reuqired  . Obesity     Family History  Problem Relation Age of Onset  . Breast cancer Maternal Grandmother   . Colon cancer Mother        in her 76s  . Renal cancer Mother   . Colon polyps Mother   . Hypertension Father   . Skin cancer Father   . Lymphoma Father   . Esophageal cancer Father   . Stomach cancer Maternal Grandfather   . Brain cancer Paternal Grandmother   . Esophageal cancer Paternal Grandfather   . Rectal cancer Neg Hx     Past Surgical History:  Procedure Laterality Date  . CESAREAN SECTION     x 2  . COLONOSCOPY    . DIAGNOSTIC LAPAROSCOPY     x 2 surg,for ectopic preg w/ removal of tube  . DIAGNOSTIC LAPAROSCOPY     endometriosis - removed tube  . fibroid tumor removal    . HYSTEROSCOPY WITH THERMACHOICE  06/14/2011  Procedure: HYSTEROSCOPY WITH THERMACHOICE;  Surgeon: Geryl RankinsEvelyn Varnado, MD;  Location: [facility];  Service: Gynecology;;  Therma choice started at 1413  . Lap Band surgery  2004  . UPPER GASTROINTESTINAL ENDOSCOPY    . WISDOM TOOTH EXTRACTION     Social History   Occupational History  . Not on file  Tobacco Use  . Smoking status: Never Smoker  . Smokeless tobacco: Never Used  Substance and Sexual Activity  . Alcohol use: Yes    Comment: socially  . Drug use: No  . Sexual activity: Yes    Birth control/protection: None

## 2017-08-22 ENCOUNTER — Telehealth (INDEPENDENT_AMBULATORY_CARE_PROVIDER_SITE_OTHER): Payer: Self-pay | Admitting: Radiology

## 2017-08-22 MED FILL — traMADol HCL 50 MG TABS: 50 | 10 days supply | Qty: 40 | Fill #0

## 2017-08-22 NOTE — Telephone Encounter (Signed)
Patient called and said that the tramadol Rx is not at her pharmacy.  I called pharm with Rx for her, it printed off and did not send to pharm/was not called in on day of her visit.  Patient aware.

## 2017-09-13 ENCOUNTER — Encounter: Payer: Self-pay | Admitting: *Deleted

## 2017-10-05 ENCOUNTER — Ambulatory Visit: Payer: 59 | Admitting: Internal Medicine

## 2017-10-11 MED FILL — AMITRIPTYLINE HCL 50 MG TAB: 50 | 90 days supply | Qty: 90 | Fill #2

## 2017-11-01 MED FILL — OMEPRAZOLE 20 MG CPDR: 20 | 90 days supply | Qty: 180 | Fill #0

## 2017-12-04 ENCOUNTER — Other Ambulatory Visit (INDEPENDENT_AMBULATORY_CARE_PROVIDER_SITE_OTHER): Payer: Self-pay | Admitting: Specialist

## 2017-12-04 NOTE — Telephone Encounter (Signed)
Tramadol refill request 

## 2017-12-05 MED FILL — traMADol HCL 50 MG TABS: 50 | 10 days supply | Qty: 40 | Fill #0

## 2017-12-05 NOTE — Telephone Encounter (Signed)
Called to [facility] outpt pharmacy

## 2018-01-07 MED FILL — AMITRIPTYLINE HCL 50 MG TAB: 50 | 90 days supply | Qty: 90 | Fill #3

## 2018-02-12 MED FILL — OMEPRAZOLE 20 MG CPDR: 20 | 90 days supply | Qty: 180 | Fill #1

## 2018-02-20 ENCOUNTER — Ambulatory Visit (INDEPENDENT_AMBULATORY_CARE_PROVIDER_SITE_OTHER): Payer: Self-pay | Admitting: Specialist

## 2018-04-01 MED FILL — AMITRIPTYLINE HCL 50 MG TAB: 50 | 90 days supply | Qty: 90 | Fill #0

## 2018-04-11 ENCOUNTER — Other Ambulatory Visit (INDEPENDENT_AMBULATORY_CARE_PROVIDER_SITE_OTHER): Payer: Self-pay | Admitting: Specialist

## 2018-04-11 MED FILL — traMADol HCL 50 MG TABS: 50 | 10 days supply | Qty: 40 | Fill #0

## 2018-04-11 NOTE — Telephone Encounter (Signed)
Tramadol refill request 

## 2018-05-22 MED FILL — OMEPRAZOLE 20 MG CPDR: 20 | 90 days supply | Qty: 180 | Fill #0

## 2018-07-01 ENCOUNTER — Other Ambulatory Visit (INDEPENDENT_AMBULATORY_CARE_PROVIDER_SITE_OTHER): Payer: Self-pay | Admitting: Specialist

## 2018-07-01 MED FILL — AMITRIPTYLINE HCL 50 MG TAB: 50 | 90 days supply | Qty: 90 | Fill #0

## 2018-07-02 MED FILL — traMADol HCL 50 MG TABS: 50 | 10 days supply | Qty: 40 | Fill #0

## 2018-07-02 NOTE — Telephone Encounter (Signed)
Tramadol refill request 

## 2018-08-26 MED FILL — OMEPRAZOLE 20 MG CPDR: 20 | 90 days supply | Qty: 180 | Fill #0

## 2018-10-10 MED FILL — AMITRIPTYLINE HCL 50 MG TAB: 50 | 90 days supply | Qty: 90 | Fill #1

## 2018-10-31 ENCOUNTER — Ambulatory Visit: Payer: Self-pay | Admitting: General Surgery

## 2018-10-31 ENCOUNTER — Other Ambulatory Visit: Payer: Self-pay | Admitting: General Surgery

## 2018-11-14 ENCOUNTER — Other Ambulatory Visit: Payer: Self-pay

## 2018-11-14 DIAGNOSIS — Z20822 Contact with and (suspected) exposure to covid-19: Secondary | ICD-10-CM

## 2018-11-16 LAB — NOVEL CORONAVIRUS, NAA: SARS-CoV-2, NAA: NOT DETECTED

## 2018-11-28 ENCOUNTER — Other Ambulatory Visit (INDEPENDENT_AMBULATORY_CARE_PROVIDER_SITE_OTHER): Payer: Self-pay | Admitting: Specialist

## 2018-11-28 MED FILL — traMADol HCL 50 MG TABS: 50 | 10 days supply | Qty: 40 | Fill #0

## 2018-11-28 MED FILL — OMEPRAZOLE 20 MG CAPSULE DR: 20 | 90 days supply | Qty: 180 | Fill #1

## 2018-12-30 MED FILL — AMITRIPTYLINE HCL 50 MG TAB: 50 | 90 days supply | Qty: 90 | Fill #2

## 2019-01-27 ENCOUNTER — Other Ambulatory Visit (INDEPENDENT_AMBULATORY_CARE_PROVIDER_SITE_OTHER): Payer: Self-pay | Admitting: Specialist

## 2019-01-28 MED FILL — traMADol HCL 50 MG TABS: 50 | 10 days supply | Qty: 40 | Fill #0

## 2019-03-13 MED FILL — AMITRIPTYLINE HCL 50 MG TAB: 50 | 90 days supply | Qty: 90 | Fill #0

## 2019-03-13 MED FILL — OMEPRAZOLE 20 MG CAP: 20 | 90 days supply | Qty: 180 | Fill #0

## 2019-04-17 ENCOUNTER — Other Ambulatory Visit (INDEPENDENT_AMBULATORY_CARE_PROVIDER_SITE_OTHER): Payer: Self-pay | Admitting: Specialist

## 2019-04-17 ENCOUNTER — Other Ambulatory Visit: Payer: Self-pay | Admitting: Specialist

## 2019-04-17 NOTE — Telephone Encounter (Signed)
We haven't seen Heidi Jordan in a while so I am concerned about prescribing Tramadol #40 in December 2020 and now a need for renewal. Will send in #10 and need to ask her to get an appointment. It is not reasonable to  Prescribe an opioid and not have a recent appointment.

## 2019-04-18 MED FILL — traMADol HCL 50 MG TABS: 50 | 3 days supply | Qty: 10 | Fill #0

## 2019-04-21 NOTE — Telephone Encounter (Signed)
Patient sched for 05/08/19 @145  pm

## 2019-05-08 ENCOUNTER — Ambulatory Visit: Payer: 59 | Admitting: Specialist

## 2019-05-08 ENCOUNTER — Encounter: Payer: Self-pay | Admitting: Specialist

## 2019-05-08 ENCOUNTER — Other Ambulatory Visit: Payer: Self-pay

## 2019-05-08 ENCOUNTER — Ambulatory Visit: Payer: Self-pay

## 2019-05-08 VITALS — BP 146/105 | HR 88 | Ht 62.0 in | Wt 195.0 lb

## 2019-05-08 DIAGNOSIS — M5134 Other intervertebral disc degeneration, thoracic region: Secondary | ICD-10-CM | POA: Diagnosis not present

## 2019-05-08 DIAGNOSIS — N6489 Other specified disorders of breast: Secondary | ICD-10-CM | POA: Diagnosis not present

## 2019-05-08 DIAGNOSIS — M47814 Spondylosis without myelopathy or radiculopathy, thoracic region: Secondary | ICD-10-CM | POA: Diagnosis not present

## 2019-05-08 DIAGNOSIS — M549 Dorsalgia, unspecified: Secondary | ICD-10-CM

## 2019-05-08 MED ORDER — TRAMADOL HCL 50 MG PO TABS: 50.0000 mg | ORAL_TABLET | Freq: Four times a day (QID) | ORAL | 0 refills | Status: AC | PRN

## 2019-05-08 MED FILL — traMADol HCL 50 MG TABS: 50 | 7 days supply | Qty: 30 | Fill #0

## 2019-05-08 NOTE — Progress Notes (Signed)
Office Visit Note   Patient: Heidi Jordan           Date of Birth: 12/23/1965           MRN: [id number] Visit Date: 05/08/2019              Requested by: Tally Joe, MD [phone] [address] [place],  [place] [address] PCP: Tally Joe, MD   Assessment & Plan: Visit Diagnoses:  1. Mid back pain   2. Thoracic degenerative disc disease   3. Thoracic spondylosis   4. Bilateral pendulous breasts     Plan: Avoid frequent bending and stooping  No lifting greater than 10 lbs. May use ice or moist heat for pain. Weight loss is of benefit. Best medication for lumbar disc disease is arthritis medications like motrin, celebrex and naprosyn should not use with a gastric sleeve or history of Barretts esophagus. Exercise is important to improve your indurance and does allow people to function better inspite of back pain.    Follow-Up Instructions: Return in about 6 months (around 11/07/2019).   Orders:  Orders Placed This Encounter  Procedures  . XR Thoracic Spine 2 View   No orders of the defined types were placed in this encounter.     Procedures: No procedures performed   Clinical Data: No additional findings.   Subjective: Chief Complaint  Patient presents with  . Middle Back - Pain    54 year old female with persisted pain in the mid and lower thoracic spine and this is worse with prolong sitting and computer work. She has had facet blocks and physical therapy but is needing  Continued use of tramadol to relieve her pain. She has a history of large breasts and is concerned that this may relate to her persistent pain complaints. On scale of 1-10 the pain is between an 8-10 with no relief. Lying on the living room floor helps.   Review of Systems  Constitutional: Negative for activity change, appetite change, chills, diaphoresis, fatigue, fever and unexpected weight change.  HENT: Negative.  Negative for congestion, dental problem, drooling, ear  discharge, ear pain, facial swelling, hearing loss, mouth sores, nosebleeds, postnasal drip, rhinorrhea, sinus pressure, sinus pain, sneezing, sore throat, tinnitus, trouble swallowing and voice change.   Eyes: Negative.  Negative for photophobia, pain, discharge, redness, itching and visual disturbance.  Respiratory: Negative for apnea, cough, choking, chest tightness, shortness of breath, wheezing and stridor.   Cardiovascular: Positive for chest pain. Negative for palpitations and leg swelling.  Gastrointestinal: Negative.  Negative for abdominal distention, abdominal pain, anal bleeding, blood in stool, constipation, diarrhea, nausea, rectal pain and vomiting.  Genitourinary: Negative for difficulty urinating, dyspareunia, dysuria, enuresis, flank pain, frequency, genital sores, hematuria, pelvic pain and urgency.  Musculoskeletal: Positive for back pain. Negative for arthralgias, gait problem, joint swelling, myalgias, neck pain and neck stiffness.  Skin: Negative.  Negative for color change, pallor, rash and wound.  Allergic/Immunologic: Negative for environmental allergies, food allergies and immunocompromised state.  Neurological: Negative for dizziness, tremors, seizures, syncope, facial asymmetry, speech difficulty, weakness, light-headedness, numbness and headaches.  Hematological: Negative.  Negative for adenopathy. Does not bruise/bleed easily.  Psychiatric/Behavioral: Negative for agitation, behavioral problems, confusion, decreased concentration, dysphoric mood, hallucinations, self-injury, sleep disturbance and suicidal ideas. The patient is not nervous/anxious and is not hyperactive.      Objective: Vital Signs: BP (!) 146/105 (BP Location: Left Arm, Patient Position: Sitting)   Pulse 88   Ht 5'  2" (1.575 m)   Wt 195 lb (88.5 kg)   LMP 05/15/2011   BMI 35.67 kg/m   Physical Exam Constitutional:      Appearance: She is well-developed.  HENT:     Head: Normocephalic and  atraumatic.  Eyes:     Pupils: Pupils are equal, round, and reactive to light.  Pulmonary:     Effort: Pulmonary effort is normal.     Breath sounds: Normal breath sounds.  Abdominal:     General: Bowel sounds are normal.     Palpations: Abdomen is soft.  Musculoskeletal:     Cervical back: Normal range of motion and neck supple.     Lumbar back: Positive right straight leg raise test and positive left straight leg raise test.  Skin:    General: Skin is warm and dry.  Neurological:     Mental Status: She is alert and oriented to person, place, and time.  Psychiatric:        Behavior: Behavior normal.        Thought Content: Thought content normal.        Judgment: Judgment normal.     Back Exam   Tenderness  The patient is experiencing tenderness in the thoracic.  Range of Motion  Extension: abnormal  Flexion: abnormal  Lateral bend right: abnormal  Lateral bend left: abnormal  Rotation right: abnormal   Muscle Strength  Right Quadriceps:  5/5  Left Quadriceps:  5/5  Right Hamstrings:  5/5  Left Hamstrings:  5/5   Tests  Straight leg raise right: positive Straight leg raise left: positive  Reflexes  Patellar: 2/4 Achilles: 2/4 Biceps: 2/4  Other  Toe walk: abnormal Heel walk: abnormal  Comments:  Body habitus such that she has rounding of shoulders increased thoracic kyphosis and pendulous breasts. May benefit from a reduction mammoplasties.       Specialty Comments:  No specialty comments available.  Imaging: XR Thoracic Spine 2 View  Result Date: 05/08/2019 AP and lateral thoracic radiographs with multiple level anterior disc degeneration with increase in thoracic kyphosis to 56 degrees.    PMFS History: There are no problems to display for this patient.  Past Medical History:  Diagnosis Date  . Anemia   . Chronic back pain    tx with ibuprofen  . Chronic tension headaches   . External hemorrhoids   . GERD (gastroesophageal reflux  disease)   . Hiatal hernia   . Insomnia    tx with amitriptyline  . Internal hemorrhoids   . Iron deficiency   . Laryngopharyngeal reflux   . Missed abortion    no surgery reuqired  . Obesity     Family History  Problem Relation Age of Onset  . Breast cancer Maternal Grandmother   . Colon cancer Mother        in her 43s  . Renal cancer Mother   . Colon polyps Mother   . Hypertension Father   . Skin cancer Father   . Lymphoma Father   . Esophageal cancer Father   . Stomach cancer Maternal Grandfather   . Brain cancer Paternal Grandmother   . Esophageal cancer Paternal Grandfather   . Rectal cancer Neg Hx     Past Surgical History:  Procedure Laterality Date  . CESAREAN SECTION     x 2  . COLONOSCOPY    . DIAGNOSTIC LAPAROSCOPY     x 2 surg,for ectopic preg w/ removal of tube  . DIAGNOSTIC LAPAROSCOPY  endometriosis - removed tube  . fibroid tumor removal    . HYSTEROSCOPY WITH THERMACHOICE  06/14/2011   Procedure: HYSTEROSCOPY WITH THERMACHOICE;  Surgeon: Geryl Rankins, MD;  Location: [facility];  Service: Gynecology;;  Therma choice started at 1413  . Lap Band surgery  2004  . UPPER GASTROINTESTINAL ENDOSCOPY    . WISDOM TOOTH EXTRACTION     Social History   Occupational History  . Not on file  Tobacco Use  . Smoking status: Never Smoker  . Smokeless tobacco: Never Used  Substance and Sexual Activity  . Alcohol use: Yes    Comment: socially  . Drug use: No  . Sexual activity: Yes    Birth control/protection: None

## 2019-06-17 MED FILL — AMITRIPTYLINE HCL 50 MG TAB: 50 | 90 days supply | Qty: 90 | Fill #0

## 2019-06-17 MED FILL — OMEPRAZOLE 20 MG CAP: 20 | 90 days supply | Qty: 180 | Fill #0

## 2019-06-27 ENCOUNTER — Telehealth: Payer: Self-pay | Admitting: Radiology

## 2019-06-27 MED FILL — traMADol HCL 50 MG TABS: 50 | 3 days supply | Qty: 10 | Fill #0

## 2019-06-27 NOTE — Telephone Encounter (Signed)
Received call from [facility]. She states that she sees Dr. Otelia Sergeant yearly and he prescribes Tramadol with enough refills for the year. The last rx that she got did not have any refills available. She will have enough medication to get through next Tuesday, but will need a refill. She would like this sent to [facility].  I advised I would hold message for you so that you could discuss with Dr. Otelia Sergeant. She is unsure if he wants to put additional refills on script or if she will need to request each time.   CB [phone]

## 2019-07-01 NOTE — Telephone Encounter (Signed)
Received call from [facility]. She states that she sees Dr. Otelia Sergeant yearly and he prescribes Tramadol with enough refills for the year. The last rx that she got did not have any refills available. She will have enough medication to get through next Tuesday, but will need a refill. She would like this sent to [facility].   I advised I would hold message for you so that you could discuss with Dr. Otelia Sergeant. She is unsure if he wants to put additional refills on script or if she will need to request each time.

## 2019-07-02 NOTE — Telephone Encounter (Signed)
Tramadol is an opioid, it is not a surgical solution for her pain and as such It may be best if she receives this from  A pain management physician. If she is to continue to receive it from me then I will need to see her more often and I can only prescribe one week at a time. I am not a pain management specialist. jen

## 2019-07-03 NOTE — Telephone Encounter (Signed)
Patient state that she will contact Dr. Ollen Bowl as he is the one that does her injections for her back.

## 2019-07-09 ENCOUNTER — Other Ambulatory Visit: Payer: Self-pay | Admitting: Obstetrics and Gynecology

## 2019-07-16 MED FILL — CLOBETASOL PROPIONATE 0.05: 0.05 | 30 days supply | Qty: 60 | Fill #0

## 2019-08-15 ENCOUNTER — Other Ambulatory Visit (INDEPENDENT_AMBULATORY_CARE_PROVIDER_SITE_OTHER): Payer: Self-pay | Admitting: Specialist

## 2019-08-15 NOTE — Telephone Encounter (Signed)
Pls advise.  

## 2019-08-20 MED FILL — traMADol HCL 50 MG TABS: 50 | 2 days supply | Qty: 10 | Fill #0

## 2019-08-26 ENCOUNTER — Ambulatory Visit (INDEPENDENT_AMBULATORY_CARE_PROVIDER_SITE_OTHER): Payer: No Typology Code available for payment source | Admitting: Plastic Surgery

## 2019-08-26 ENCOUNTER — Other Ambulatory Visit: Payer: Self-pay

## 2019-08-26 ENCOUNTER — Encounter: Payer: Self-pay | Admitting: Plastic Surgery

## 2019-08-26 DIAGNOSIS — N62 Hypertrophy of breast: Secondary | ICD-10-CM | POA: Diagnosis not present

## 2019-08-26 DIAGNOSIS — G8929 Other chronic pain: Secondary | ICD-10-CM

## 2019-08-26 DIAGNOSIS — M793 Panniculitis, unspecified: Secondary | ICD-10-CM

## 2019-08-26 DIAGNOSIS — M549 Dorsalgia, unspecified: Secondary | ICD-10-CM | POA: Insufficient documentation

## 2019-08-26 DIAGNOSIS — M542 Cervicalgia: Secondary | ICD-10-CM | POA: Diagnosis not present

## 2019-08-26 DIAGNOSIS — M546 Pain in thoracic spine: Secondary | ICD-10-CM

## 2019-08-26 NOTE — Progress Notes (Signed)
Patient ID: Heidi Jordan, female    DOB: [birth year], 54 y.o.   MRN: [id number]   Chief Complaint  Patient presents with  . Advice Only    Mammary Hyperplasia: The patient is a 54 y.o. female with a history of mammary hyperplasia for several years.  She has extremely large breasts causing symptoms that include the following: Back pain in the upper and lower back, including neck pain. She pulls or pins her bra straps to provide better lift and relief of the pressure and pain. She notices relief by holding her breast up manually.  Her shoulder straps cause grooves and pain and pressure that requires padding for relief. Pain medication is sometimes required with motrin and tylenol.  Activities that are hindered by enlarged breasts include: exercise and running.  She has ongoing neck and back pain.  She has seen Dr. Otelia Sergeant in orthopedics and got an injection.  She is also seen the neuro team and had several injections.  They do not help long-term.  She has some rashes in her inframammary folds.  She is not a smoker and has seen physical therapy in the past.  She does not have diabetes.  Her breasts are extremely large and fairly symmetric.  She has hyperpigmentation of the inframammary area on both sides.  The sternal to nipple distance on the right is 28 cm and the left is 29 cm.  The IMF distance is 13 cm.  She is 5 feet 3 inches tall and weighs 201 pounds.  Preoperative bra size =  40DD cup.  She would like to be a small C cup the estimated excess breast tissue to be removed at the time of surgery = 500 grams on the left and 500 grams on the right.  Mammogram history: June 2018 and it was negative she is due for another mammogram.  She is also interested in a panniculectomy.  She complains of rashes in her skin folds and severe back pain.   Review of Systems  Constitutional: Positive for activity change.  HENT: Negative.   Eyes: Negative.   Respiratory: Negative.  Negative for chest  tightness and shortness of breath.   Cardiovascular: Negative for chest pain.  Gastrointestinal: Negative.  Negative for abdominal distention and abdominal pain.  Endocrine: Negative.   Genitourinary: Negative.   Musculoskeletal: Positive for back pain.  Skin: Positive for rash. Negative for color change.    Past Medical History:  Diagnosis Date  . Anemia   . Chronic back pain    tx with ibuprofen  . Chronic tension headaches   . External hemorrhoids   . GERD (gastroesophageal reflux disease)   . Hiatal hernia   . Insomnia    tx with amitriptyline  . Internal hemorrhoids   . Iron deficiency   . Laryngopharyngeal reflux   . Missed abortion    no surgery reuqired  . Obesity     Past Surgical History:  Procedure Laterality Date  . CESAREAN SECTION     x 2  . COLONOSCOPY    . DIAGNOSTIC LAPAROSCOPY     x 2 surg,for ectopic preg w/ removal of tube  . DIAGNOSTIC LAPAROSCOPY     endometriosis - removed tube  . fibroid tumor removal    . HYSTEROSCOPY WITH THERMACHOICE  06/14/2011   Procedure: HYSTEROSCOPY WITH THERMACHOICE;  Surgeon: Geryl Rankins, MD;  Location: [facility];  Service: Gynecology;;  Therma choice started at 1413  . Lap Band surgery  2004  .  UPPER GASTROINTESTINAL ENDOSCOPY    . WISDOM TOOTH EXTRACTION        Current Outpatient Medications:  .  amitriptyline (ELAVIL) 50 MG tablet, Take 50 mg by mouth at bedtime., Disp: , Rfl:  .  aspirin 325 MG tablet, Take 325 mg by mouth daily., Disp: , Rfl:  .  omeprazole (PRILOSEC) 40 MG capsule, Take 40 mg by mouth daily., Disp: , Rfl:  .  traMADol (ULTRAM) 50 MG tablet, TAKE 1 TABLET BY MOUTH EVERY 6 HOURS AS NEEDED, Disp: 10 tablet, Rfl: 0  Current Facility-Administered Medications:  .  0.9 %  sodium chloride infusion, 500 mL, Intravenous, Once, Pyrtle, Carie Caddy, MD   Objective:   Vitals:   08/26/19 1529  BP: (!) 147/98  Pulse: 98  Temp: 98.1 F (36.7 C)  SpO2: 97%    Physical Exam Vitals and nursing note  reviewed.  Constitutional:      Appearance: Normal appearance.  HENT:     Head: Normocephalic and atraumatic.  Cardiovascular:     Rate and Rhythm: Normal rate.     Pulses: Normal pulses.  Pulmonary:     Effort: Pulmonary effort is normal. No respiratory distress.  Abdominal:     General: Abdomen is flat. There is no distension.     Palpations: There is no mass.     Tenderness: There is no abdominal tenderness.  Skin:    General: Skin is warm.  Neurological:     General: No focal deficit present.     Mental Status: She is alert.  Psychiatric:        Mood and Affect: Mood normal.        Behavior: Behavior normal.        Thought Content: Thought content normal.     Assessment & Plan:  Chronic bilateral thoracic back pain  Neck pain  Symptomatic mammary hypertrophy  Panniculitis I recommend bilateral breast reduction with lateral liposuction.  I also recommend physical therapy evaluation and treatment.  The patient is also a candidate for a panniculectomy.  Pictures were obtained of the patient and placed in the chart with the patient's or guardian's permission.  Alena Bills Yoshi Vicencio, DO

## 2019-09-04 ENCOUNTER — Ambulatory Visit: Payer: No Typology Code available for payment source

## 2019-09-08 ENCOUNTER — Ambulatory Visit
Admission: RE | Admit: 2019-09-08 | Discharge: 2019-09-08 | Disposition: A | Discharge status: Home / Self Care | Payer: No Typology Code available for payment source | Source: Ambulatory Visit | Attending: Plastic Surgery | Admitting: Plastic Surgery

## 2019-09-08 ENCOUNTER — Other Ambulatory Visit: Payer: Self-pay

## 2019-09-08 DIAGNOSIS — N62 Hypertrophy of breast: Secondary | ICD-10-CM

## 2019-09-08 DIAGNOSIS — M546 Pain in thoracic spine: Secondary | ICD-10-CM

## 2019-09-08 DIAGNOSIS — M542 Cervicalgia: Secondary | ICD-10-CM

## 2019-09-08 DIAGNOSIS — M793 Panniculitis, unspecified: Secondary | ICD-10-CM

## 2019-09-15 MED FILL — AMITRIPTYLINE HCL 50 MG TAB: 50 | 30 days supply | Qty: 30 | Fill #0

## 2019-09-17 ENCOUNTER — Ambulatory Visit: Payer: No Typology Code available for payment source | Admitting: Physical Therapy

## 2019-09-30 ENCOUNTER — Other Ambulatory Visit (HOSPITAL_COMMUNITY): Payer: Self-pay | Admitting: Family Medicine

## 2019-09-30 MED FILL — OMEPRAZOLE 20 MG CAP: 20 | 90 days supply | Qty: 180 | Fill #0

## 2019-09-30 MED FILL — traMADol HCL 50 MG TABS: 50 | 5 days supply | Qty: 30 | Fill #0

## 2019-10-12 MED FILL — AMITRIPTYLINE HCL 50 MG TAB: 50 | 90 days supply | Qty: 90 | Fill #0

## 2019-11-04 ENCOUNTER — Other Ambulatory Visit: Payer: Self-pay | Admitting: Surgical

## 2019-11-04 ENCOUNTER — Ambulatory Visit (INDEPENDENT_AMBULATORY_CARE_PROVIDER_SITE_OTHER): Payer: No Typology Code available for payment source | Admitting: Surgical

## 2019-11-04 ENCOUNTER — Other Ambulatory Visit: Payer: Self-pay

## 2019-11-04 ENCOUNTER — Encounter: Payer: Self-pay | Admitting: Surgical

## 2019-11-04 VITALS — BP 140/82 | HR 86 | Temp 97.8°F | Ht 63.0 in | Wt 200.0 lb

## 2019-11-04 DIAGNOSIS — G8929 Other chronic pain: Secondary | ICD-10-CM

## 2019-11-04 DIAGNOSIS — M542 Cervicalgia: Secondary | ICD-10-CM

## 2019-11-04 DIAGNOSIS — M546 Pain in thoracic spine: Secondary | ICD-10-CM

## 2019-11-04 DIAGNOSIS — N62 Hypertrophy of breast: Secondary | ICD-10-CM

## 2019-11-04 MED ORDER — ONDANSETRON HCL 4 MG PO TABS: 4.0000 mg | ORAL_TABLET | Freq: Three times a day (TID) | ORAL | 0 refills | Status: DC | PRN

## 2019-11-04 MED ORDER — CEPHALEXIN 500 MG PO CAPS: 500.0000 mg | ORAL_CAPSULE | Freq: Four times a day (QID) | ORAL | 0 refills | Status: DC

## 2019-11-04 MED ORDER — HYDROCODONE-ACETAMINOPHEN 5-325 MG PO TABS: 1.0000 | ORAL_TABLET | Freq: Four times a day (QID) | ORAL | 0 refills | Status: DC | PRN

## 2019-11-04 MED FILL — HYDROCODON-APAP 5-325: 5-325 | 5 days supply | Qty: 20 | Fill #0

## 2019-11-04 MED FILL — CEPHALEXIN 500 MG CAPSULE: 500 | 3 days supply | Qty: 12 | Fill #0

## 2019-11-04 MED FILL — ONDANSETRON HCL 4 MG TABS: 4 | 6 days supply | Qty: 20 | Fill #0

## 2019-11-04 NOTE — H&P (View-Only) (Signed)
Patient ID: Heidi Jordan, female    DOB: Jun 25, 1965, 54 y.o.   MRN: [id number]  Chief Complaint  Patient presents with  . Pre-op Exam      ICD-10-CM   1. Chronic bilateral thoracic back pain  M54.6    G89.29   2. Neck pain  M54.2   3. Symptomatic mammary hypertrophy  N62      History of Present Illness: Heidi Jordan is a 54 y.o.  female  with a history of macromastia.  She presents for preoperative evaluation for upcoming procedure, bilateral breast reduction with liposuction, scheduled for 11/17/2019 with Dr. Ulice Bold  The patient has not had problems with anesthesia. No history of DVT/PE.  No family history of DVT/PE.  No family or personal history of bleeding or clotting disorders.  Patient is not currently taking any blood thinners.  No history of CVA/MI.   Summary of Previous Visit: Patient has extremely large and fairly symmetric breasts, hyperpigmentation of the inframammary fold on both sides, sternal to nipple distance on the right is 28 cm and the left is 29 cm.  The IMF distance is 13 cm.  Patient's preoperative bra size is 40 DD cup.  She would like to be a small C cup.  Estimated excess breast tissue to remove the time of surgery is equal to 500 g on the left and 500 g on the right.  Patient had recent screening mammogram on 09/08/2019 which showed no mammographic evidence of malignancy.  Job: Garment/textile technologist for [employer], works from home  PMH Significant for: History of chronic back pain, anemia, GERD, iron deficiency.   Past Medical History: Allergies: Allergies  Allergen Reactions  . Sulfa Drugs Cross Reactors Other (See Comments)    casues migraine headaches    Current Medications:  Current Outpatient Medications:  .  amitriptyline (ELAVIL) 50 MG tablet, Take 50 mg by mouth at bedtime., Disp: , Rfl:  .  aspirin 325 MG tablet, Take 325 mg by mouth daily., Disp: , Rfl:  .  omeprazole (PRILOSEC) 40 MG capsule, Take 40 mg by mouth daily.,  Disp: , Rfl:  .  traMADol (ULTRAM) 50 MG tablet, TAKE 1 TABLET BY MOUTH EVERY 6 HOURS AS NEEDED, Disp: 10 tablet, Rfl: 0  Current Facility-Administered Medications:  .  0.9 %  sodium chloride infusion, 500 mL, Intravenous, Once, Pyrtle, Carie Caddy, MD  Past Medical Problems: Past Medical History:  Diagnosis Date  . Anemia   . Chronic back pain    tx with ibuprofen  . Chronic tension headaches   . External hemorrhoids   . GERD (gastroesophageal reflux disease)   . Hiatal hernia   . Insomnia    tx with amitriptyline  . Internal hemorrhoids   . Iron deficiency   . Laryngopharyngeal reflux   . Missed abortion    no surgery reuqired  . Obesity     Past Surgical History: Past Surgical History:  Procedure Laterality Date  . CESAREAN SECTION     x 2  . COLONOSCOPY    . DIAGNOSTIC LAPAROSCOPY     x 2 surg,for ectopic preg w/ removal of tube  . DIAGNOSTIC LAPAROSCOPY     endometriosis - removed tube  . fibroid tumor removal    . HYSTEROSCOPY WITH THERMACHOICE  06/14/2011   Procedure: HYSTEROSCOPY WITH THERMACHOICE;  Surgeon: Geryl Rankins, MD;  Location: [facility];  Service: Gynecology;;  Therma choice started at 1413  . Lap Band surgery  2004  .  UPPER GASTROINTESTINAL ENDOSCOPY    . WISDOM TOOTH EXTRACTION      Social History: Social History   Socioeconomic History  . Marital status: Married    Spouse name: Not on file  . Number of children: Not on file  . Years of education: Not on file  . Highest education level: Not on file  Occupational History  . Not on file  Tobacco Use  . Smoking status: Never Smoker  . Smokeless tobacco: Never Used  Substance and Sexual Activity  . Alcohol use: Yes    Comment: socially  . Drug use: No  . Sexual activity: Yes    Birth control/protection: None  Other Topics Concern  . Not on file  Social History Narrative  . Not on file   Social Determinants of Health   Financial Resource Strain:   . Difficulty of Paying Living Expenses:  Not on file  Food Insecurity:   . Worried About Programme researcher, broadcasting/film/video in the Last Year: Not on file  . Ran Out of Food in the Last Year: Not on file  Transportation Needs:   . Lack of Transportation (Medical): Not on file  . Lack of Transportation (Non-Medical): Not on file  Physical Activity:   . Days of Exercise per Week: Not on file  . Minutes of Exercise per Session: Not on file  Stress:   . Feeling of Stress : Not on file  Social Connections:   . Frequency of Communication with Friends and Family: Not on file  . Frequency of Social Gatherings with Friends and Family: Not on file  . Attends Religious Services: Not on file  . Active Member of Clubs or Organizations: Not on file  . Attends Banker Meetings: Not on file  . Marital Status: Not on file  Intimate Partner Violence:   . Fear of Current or Ex-Partner: Not on file  . Emotionally Abused: Not on file  . Physically Abused: Not on file  . Sexually Abused: Not on file    Family History: Family History  Problem Relation Age of Onset  . Breast cancer Maternal Grandmother   . Colon cancer Mother        in her 38s  . Renal cancer Mother   . Colon polyps Mother   . Hypertension Father   . Skin cancer Father   . Lymphoma Father   . Esophageal cancer Father   . Stomach cancer Maternal Grandfather   . Brain cancer Paternal Grandmother   . Esophageal cancer Paternal Grandfather   . Rectal cancer Neg Hx     Review of Systems: Review of Systems  Constitutional: Negative.   Respiratory: Negative.   Cardiovascular: Negative.   Gastrointestinal: Negative.   Neurological: Negative.     Physical Exam: Vital Signs BP 140/82 (BP Location: Left Arm, Patient Position: Sitting, Cuff Size: Large)   Pulse 86   Temp 97.8 F (36.6 C) (Temporal)   Ht 5\' 3"  (1.6 m)   Wt 200 lb (90.7 kg)   LMP 05/15/2011   SpO2 99%   BMI 35.43 kg/m    Physical Exam Exam conducted with a chaperone present.  Constitutional:       General: She is not in acute distress.    Appearance: Normal appearance. She is not ill-appearing.  HENT:     Head: Normocephalic and atraumatic.  Eyes:     Pupils: Pupils are equal, round Neck:     Musculoskeletal: Normal range of motion.  Cardiovascular:  Rate and Rhythm: Normal rate and regular rhythm.     Pulses: Normal pulses.     Heart sounds: Normal heart sounds. No murmur.  Pulmonary:     Effort: Pulmonary effort is normal. No respiratory distress.     Breath sounds: Normal breath sounds. No wheezing.  Abdominal:     General: Abdomen is flat. There is no distension.     Palpations: Abdomen is soft.     Tenderness: There is no abdominal tenderness.  Musculoskeletal: Normal range of motion.  Skin:    General: Skin is warm and dry.     Findings: No erythema or rash.  Neurological:     General: No focal deficit present.     Mental Status: She is alert and oriented to person, place, and time. Mental status is at baseline.     Motor: No weakness.  Psychiatric:        Mood and Affect: Mood normal.        Behavior: Behavior normal.   Assessment/Plan: The patient is scheduled for bilateral breast reduction with Dr. Ulice Bold.  Risks, benefits, and alternatives of procedure discussed, questions answered and consent obtained.   Smoking Status: Non smoker Last Mammogram: Negative on 09/08/19  Caprini Score: 4; Risk Factors include: age, BMI > 25, and length of planned surgery. Recommendation for mechanical prophylaxis. Encourage early ambulation.   Pictures obtained: @consult   Post-op Rx sent to pharmacy: norco, zofran, keflex  Patient was provided with the breast reduction and General Surgical Risk consent document and Pain Medication Agreement prior to their appointment.  They had adequate time to read through the risk consent documents and Pain Medication Agreement. We also discussed them in person together during this preop appointment. All of their questions were  answered to their satisfaction.  Recommended calling if they have any further questions.  Risk consent form and Pain Medication Agreement to be scanned into patient's chart.  The risk that can be encountered with breast reduction were discussed and include the following but not limited to these:  Breast asymmetry, fluid accumulation, firmness of the breast, inability to breast feed, loss of nipple or areola, skin loss, decrease or no nipple sensation, fat necrosis of the breast tissue, bleeding, infection, healing delay.  There are risks of anesthesia, changes to skin sensation and injury to nerves or blood vessels.  The muscle can be temporarily or permanently injured.  You may have an allergic reaction to tape, suture, glue, blood products which can result in skin discoloration, swelling, pain, skin lesions, poor healing.  Any of these can lead to the need for revisonal surgery or stage procedures.  A reduction has potential to interfere with diagnostic procedures.  Nipple or breast piercing can increase risks of infection.  This procedure is best done when the breast is fully developed.  Changes in the breast will continue to occur over time.  Pregnancy can alter the outcomes of previous breast reduction surgery, weight gain and weigh loss can also effect the long term appearance.     Electronically signed by: Terence Bart, PA-C 11/04/2019 4:09 PM

## 2019-11-04 NOTE — Progress Notes (Signed)
Patient ID: Heidi Jordan, female    DOB: Jun 25, 1965, 54 y.o.   MRN: [id number]  Chief Complaint  Patient presents with  . Pre-op Exam      ICD-10-CM   1. Chronic bilateral thoracic back pain  M54.6    G89.29   2. Neck pain  M54.2   3. Symptomatic mammary hypertrophy  N62      History of Present Illness: Heidi Jordan is a 54 y.o.  female  with a history of macromastia.  She presents for preoperative evaluation for upcoming procedure, bilateral breast reduction with liposuction, scheduled for 11/17/2019 with Dr. Ulice Bold  The patient has not had problems with anesthesia. No history of DVT/PE.  No family history of DVT/PE.  No family or personal history of bleeding or clotting disorders.  Patient is not currently taking any blood thinners.  No history of CVA/MI.   Summary of Previous Visit: Patient has extremely large and fairly symmetric breasts, hyperpigmentation of the inframammary fold on both sides, sternal to nipple distance on the right is 28 cm and the left is 29 cm.  The IMF distance is 13 cm.  Patient's preoperative bra size is 40 DD cup.  She would like to be a small C cup.  Estimated excess breast tissue to remove the time of surgery is equal to 500 g on the left and 500 g on the right.  Patient had recent screening mammogram on 09/08/2019 which showed no mammographic evidence of malignancy.  Job: Garment/textile technologist for [employer], works from home  PMH Significant for: History of chronic back pain, anemia, GERD, iron deficiency.   Past Medical History: Allergies: Allergies  Allergen Reactions  . Sulfa Drugs Cross Reactors Other (See Comments)    casues migraine headaches    Current Medications:  Current Outpatient Medications:  .  amitriptyline (ELAVIL) 50 MG tablet, Take 50 mg by mouth at bedtime., Disp: , Rfl:  .  aspirin 325 MG tablet, Take 325 mg by mouth daily., Disp: , Rfl:  .  omeprazole (PRILOSEC) 40 MG capsule, Take 40 mg by mouth daily.,  Disp: , Rfl:  .  traMADol (ULTRAM) 50 MG tablet, TAKE 1 TABLET BY MOUTH EVERY 6 HOURS AS NEEDED, Disp: 10 tablet, Rfl: 0  Current Facility-Administered Medications:  .  0.9 %  sodium chloride infusion, 500 mL, Intravenous, Once, Pyrtle, Carie Caddy, MD  Past Medical Problems: Past Medical History:  Diagnosis Date  . Anemia   . Chronic back pain    tx with ibuprofen  . Chronic tension headaches   . External hemorrhoids   . GERD (gastroesophageal reflux disease)   . Hiatal hernia   . Insomnia    tx with amitriptyline  . Internal hemorrhoids   . Iron deficiency   . Laryngopharyngeal reflux   . Missed abortion    no surgery reuqired  . Obesity     Past Surgical History: Past Surgical History:  Procedure Laterality Date  . CESAREAN SECTION     x 2  . COLONOSCOPY    . DIAGNOSTIC LAPAROSCOPY     x 2 surg,for ectopic preg w/ removal of tube  . DIAGNOSTIC LAPAROSCOPY     endometriosis - removed tube  . fibroid tumor removal    . HYSTEROSCOPY WITH THERMACHOICE  06/14/2011   Procedure: HYSTEROSCOPY WITH THERMACHOICE;  Surgeon: Geryl Rankins, MD;  Location: [facility];  Service: Gynecology;;  Therma choice started at 1413  . Lap Band surgery  2004  .  UPPER GASTROINTESTINAL ENDOSCOPY    . WISDOM TOOTH EXTRACTION      Social History: Social History   Socioeconomic History  . Marital status: Married    Spouse name: Not on file  . Number of children: Not on file  . Years of education: Not on file  . Highest education level: Not on file  Occupational History  . Not on file  Tobacco Use  . Smoking status: Never Smoker  . Smokeless tobacco: Never Used  Substance and Sexual Activity  . Alcohol use: Yes    Comment: socially  . Drug use: No  . Sexual activity: Yes    Birth control/protection: None  Other Topics Concern  . Not on file  Social History Narrative  . Not on file   Social Determinants of Health   Financial Resource Strain:   . Difficulty of Paying Living Expenses:  Not on file  Food Insecurity:   . Worried About Programme researcher, broadcasting/film/video in the Last Year: Not on file  . Ran Out of Food in the Last Year: Not on file  Transportation Needs:   . Lack of Transportation (Medical): Not on file  . Lack of Transportation (Non-Medical): Not on file  Physical Activity:   . Days of Exercise per Week: Not on file  . Minutes of Exercise per Session: Not on file  Stress:   . Feeling of Stress : Not on file  Social Connections:   . Frequency of Communication with Friends and Family: Not on file  . Frequency of Social Gatherings with Friends and Family: Not on file  . Attends Religious Services: Not on file  . Active Member of Clubs or Organizations: Not on file  . Attends Banker Meetings: Not on file  . Marital Status: Not on file  Intimate Partner Violence:   . Fear of Current or Ex-Partner: Not on file  . Emotionally Abused: Not on file  . Physically Abused: Not on file  . Sexually Abused: Not on file    Family History: Family History  Problem Relation Age of Onset  . Breast cancer Maternal Grandmother   . Colon cancer Mother        in her 38s  . Renal cancer Mother   . Colon polyps Mother   . Hypertension Father   . Skin cancer Father   . Lymphoma Father   . Esophageal cancer Father   . Stomach cancer Maternal Grandfather   . Brain cancer Paternal Grandmother   . Esophageal cancer Paternal Grandfather   . Rectal cancer Neg Hx     Review of Systems: Review of Systems  Constitutional: Negative.   Respiratory: Negative.   Cardiovascular: Negative.   Gastrointestinal: Negative.   Neurological: Negative.     Physical Exam: Vital Signs BP 140/82 (BP Location: Left Arm, Patient Position: Sitting, Cuff Size: Large)   Pulse 86   Temp 97.8 F (36.6 C) (Temporal)   Ht 5\' 3"  (1.6 m)   Wt 200 lb (90.7 kg)   LMP 05/15/2011   SpO2 99%   BMI 35.43 kg/m    Physical Exam Exam conducted with a chaperone present.  Constitutional:       General: She is not in acute distress.    Appearance: Normal appearance. She is not ill-appearing.  HENT:     Head: Normocephalic and atraumatic.  Eyes:     Pupils: Pupils are equal, round Neck:     Musculoskeletal: Normal range of motion.  Cardiovascular:  Rate and Rhythm: Normal rate and regular rhythm.     Pulses: Normal pulses.     Heart sounds: Normal heart sounds. No murmur.  Pulmonary:     Effort: Pulmonary effort is normal. No respiratory distress.     Breath sounds: Normal breath sounds. No wheezing.  Abdominal:     General: Abdomen is flat. There is no distension.     Palpations: Abdomen is soft.     Tenderness: There is no abdominal tenderness.  Musculoskeletal: Normal range of motion.  Skin:    General: Skin is warm and dry.     Findings: No erythema or rash.  Neurological:     General: No focal deficit present.     Mental Status: She is alert and oriented to person, place, and time. Mental status is at baseline.     Motor: No weakness.  Psychiatric:        Mood and Affect: Mood normal.        Behavior: Behavior normal.   Assessment/Plan: The patient is scheduled for bilateral breast reduction with Dr. Ulice Bold.  Risks, benefits, and alternatives of procedure discussed, questions answered and consent obtained.   Smoking Status: Non smoker Last Mammogram: Negative on 09/08/19  Caprini Score: 4; Risk Factors include: age, BMI > 25, and length of planned surgery. Recommendation for mechanical prophylaxis. Encourage early ambulation.   Pictures obtained: @consult   Post-op Rx sent to pharmacy: norco, zofran, keflex  Patient was provided with the breast reduction and General Surgical Risk consent document and Pain Medication Agreement prior to their appointment.  They had adequate time to read through the risk consent documents and Pain Medication Agreement. We also discussed them in person together during this preop appointment. All of their questions were  answered to their satisfaction.  Recommended calling if they have any further questions.  Risk consent form and Pain Medication Agreement to be scanned into patient's chart.  The risk that can be encountered with breast reduction were discussed and include the following but not limited to these:  Breast asymmetry, fluid accumulation, firmness of the breast, inability to breast feed, loss of nipple or areola, skin loss, decrease or no nipple sensation, fat necrosis of the breast tissue, bleeding, infection, healing delay.  There are risks of anesthesia, changes to skin sensation and injury to nerves or blood vessels.  The muscle can be temporarily or permanently injured.  You may have an allergic reaction to tape, suture, glue, blood products which can result in skin discoloration, swelling, pain, skin lesions, poor healing.  Any of these can lead to the need for revisonal surgery or stage procedures.  A reduction has potential to interfere with diagnostic procedures.  Nipple or breast piercing can increase risks of infection.  This procedure is best done when the breast is fully developed.  Changes in the breast will continue to occur over time.  Pregnancy can alter the outcomes of previous breast reduction surgery, weight gain and weigh loss can also effect the long term appearance.     Electronically signed by: Jacques Fife, PA-C 11/04/2019 4:09 PM

## 2019-11-10 ENCOUNTER — Encounter (HOSPITAL_BASED_OUTPATIENT_CLINIC_OR_DEPARTMENT_OTHER): Payer: Self-pay | Admitting: Plastic Surgery

## 2019-11-10 ENCOUNTER — Other Ambulatory Visit: Payer: Self-pay

## 2019-11-13 ENCOUNTER — Other Ambulatory Visit (HOSPITAL_COMMUNITY): Payer: No Typology Code available for payment source

## 2019-11-14 NOTE — Progress Notes (Signed)
Left message reminding pt to go for covid test 10/16.

## 2019-11-15 ENCOUNTER — Other Ambulatory Visit (HOSPITAL_COMMUNITY)
Admission: RE | Admit: 2019-11-15 | Discharge: 2019-11-15 | Disposition: A | Discharge status: Home / Self Care | Payer: No Typology Code available for payment source | Source: Ambulatory Visit | Attending: Plastic Surgery | Admitting: Plastic Surgery

## 2019-11-15 DIAGNOSIS — Z01812 Encounter for preprocedural laboratory examination: Secondary | ICD-10-CM | POA: Diagnosis present

## 2019-11-15 DIAGNOSIS — Z20822 Contact with and (suspected) exposure to covid-19: Secondary | ICD-10-CM | POA: Insufficient documentation

## 2019-11-16 LAB — SARS CORONAVIRUS 2 (TAT 6-24 HRS): SARS Coronavirus 2: NEGATIVE

## 2019-11-17 ENCOUNTER — Ambulatory Visit (HOSPITAL_BASED_OUTPATIENT_CLINIC_OR_DEPARTMENT_OTHER): Payer: No Typology Code available for payment source | Admitting: Certified Registered Nurse Anesthetist

## 2019-11-17 ENCOUNTER — Encounter (HOSPITAL_BASED_OUTPATIENT_CLINIC_OR_DEPARTMENT_OTHER): Payer: Self-pay | Admitting: Plastic Surgery

## 2019-11-17 ENCOUNTER — Encounter (HOSPITAL_BASED_OUTPATIENT_CLINIC_OR_DEPARTMENT_OTHER)
Admission: RE | Disposition: A | Discharge status: Home / Self Care | Payer: Self-pay | Source: Home / Self Care | Attending: Plastic Surgery

## 2019-11-17 ENCOUNTER — Ambulatory Visit (HOSPITAL_BASED_OUTPATIENT_CLINIC_OR_DEPARTMENT_OTHER)
Admission: RE | Admit: 2019-11-17 | Discharge: 2019-11-17 | Disposition: A | Discharge status: Home / Self Care | Payer: No Typology Code available for payment source | Attending: Plastic Surgery | Admitting: Plastic Surgery

## 2019-11-17 ENCOUNTER — Other Ambulatory Visit: Payer: Self-pay

## 2019-11-17 DIAGNOSIS — Z6835 Body mass index (BMI) 35.0-35.9, adult: Secondary | ICD-10-CM | POA: Diagnosis not present

## 2019-11-17 DIAGNOSIS — K219 Gastro-esophageal reflux disease without esophagitis: Secondary | ICD-10-CM | POA: Diagnosis not present

## 2019-11-17 DIAGNOSIS — G8929 Other chronic pain: Secondary | ICD-10-CM

## 2019-11-17 DIAGNOSIS — N6031 Fibrosclerosis of right breast: Secondary | ICD-10-CM | POA: Insufficient documentation

## 2019-11-17 DIAGNOSIS — N6489 Other specified disorders of breast: Secondary | ICD-10-CM | POA: Insufficient documentation

## 2019-11-17 DIAGNOSIS — M542 Cervicalgia: Secondary | ICD-10-CM | POA: Diagnosis not present

## 2019-11-17 DIAGNOSIS — N6021 Fibroadenosis of right breast: Secondary | ICD-10-CM | POA: Diagnosis not present

## 2019-11-17 DIAGNOSIS — G4700 Insomnia, unspecified: Secondary | ICD-10-CM | POA: Diagnosis not present

## 2019-11-17 DIAGNOSIS — E669 Obesity, unspecified: Secondary | ICD-10-CM | POA: Insufficient documentation

## 2019-11-17 DIAGNOSIS — Z7982 Long term (current) use of aspirin: Secondary | ICD-10-CM | POA: Insufficient documentation

## 2019-11-17 DIAGNOSIS — N6032 Fibrosclerosis of left breast: Secondary | ICD-10-CM | POA: Insufficient documentation

## 2019-11-17 DIAGNOSIS — N62 Hypertrophy of breast: Secondary | ICD-10-CM

## 2019-11-17 DIAGNOSIS — M546 Pain in thoracic spine: Secondary | ICD-10-CM

## 2019-11-17 DIAGNOSIS — Z79899 Other long term (current) drug therapy: Secondary | ICD-10-CM | POA: Diagnosis not present

## 2019-11-17 HISTORY — PX: BREAST REDUCTION SURGERY: SHX8

## 2019-11-17 SURGERY — BREAST REDUCTION WITH LIPOSUCTION
Anesthesia: General | Site: Breast | Laterality: Bilateral

## 2019-11-17 MED ORDER — OXYCODONE HCL 5 MG/5ML PO SOLN: 5.0000 mg | Freq: Once | ORAL | Status: DC | PRN

## 2019-11-17 MED ORDER — AMISULPRIDE (ANTIEMETIC) 5 MG/2ML IV SOLN: 10.0000 mg | Freq: Once | INTRAVENOUS | Status: DC | PRN

## 2019-11-17 MED ORDER — SUGAMMADEX SODIUM 200 MG/2ML IV SOLN
INTRAVENOUS | Status: DC | PRN
  Administered 2019-11-17: 200 mg via INTRAVENOUS

## 2019-11-17 MED ORDER — DEXAMETHASONE SODIUM PHOSPHATE 10 MG/ML IJ SOLN
INTRAMUSCULAR | Status: DC | PRN
  Administered 2019-11-17: 10 mg via INTRAVENOUS

## 2019-11-17 MED ORDER — MEPERIDINE HCL 25 MG/ML IJ SOLN: 6.2500 mg | INTRAMUSCULAR | Status: DC | PRN

## 2019-11-17 MED ORDER — MIDAZOLAM HCL 2 MG/2ML IJ SOLN
INTRAMUSCULAR | Status: AC
  Filled 2019-11-17: qty 2

## 2019-11-17 MED ORDER — CEFAZOLIN SODIUM-DEXTROSE 2-4 GM/100ML-% IV SOLN
INTRAVENOUS | Status: AC
  Filled 2019-11-17: qty 100

## 2019-11-17 MED ORDER — DEXAMETHASONE SODIUM PHOSPHATE 10 MG/ML IJ SOLN
INTRAMUSCULAR | Status: AC
  Filled 2019-11-17: qty 1

## 2019-11-17 MED ORDER — SODIUM CHLORIDE 0.9% FLUSH: 3.0000 mL | INTRAVENOUS | Status: DC | PRN

## 2019-11-17 MED ORDER — CHLORHEXIDINE GLUCONATE CLOTH 2 % EX PADS: 6.0000 | MEDICATED_PAD | Freq: Once | CUTANEOUS | Status: DC

## 2019-11-17 MED ORDER — HYDROMORPHONE HCL 1 MG/ML IJ SOLN
0.2500 mg | INTRAMUSCULAR | Status: DC | PRN
  Administered 2019-11-17 (×4): 0.25 mg via INTRAVENOUS

## 2019-11-17 MED ORDER — OXYCODONE HCL 5 MG PO TABS: 5.0000 mg | ORAL_TABLET | Freq: Once | ORAL | Status: DC | PRN

## 2019-11-17 MED ORDER — ACETAMINOPHEN 325 MG PO TABS: 650.0000 mg | ORAL_TABLET | ORAL | Status: DC | PRN

## 2019-11-17 MED ORDER — HYDROMORPHONE HCL 1 MG/ML IJ SOLN
INTRAMUSCULAR | Status: AC
  Filled 2019-11-17: qty 0.5

## 2019-11-17 MED ORDER — FENTANYL CITRATE (PF) 100 MCG/2ML IJ SOLN: 25.0000 ug | INTRAMUSCULAR | Status: DC | PRN

## 2019-11-17 MED ORDER — ACETAMINOPHEN 325 MG RE SUPP: 650.0000 mg | RECTAL | Status: DC | PRN

## 2019-11-17 MED ORDER — LIDOCAINE-EPINEPHRINE 1 %-1:100000 IJ SOLN
INTRAMUSCULAR | Status: DC | PRN
  Administered 2019-11-17: 20 mL

## 2019-11-17 MED ORDER — LACTATED RINGERS IV SOLN: INTRAVENOUS | Status: DC

## 2019-11-17 MED ORDER — ROCURONIUM BROMIDE 10 MG/ML (PF) SYRINGE
PREFILLED_SYRINGE | INTRAVENOUS | Status: DC | PRN
  Administered 2019-11-17: 100 mg via INTRAVENOUS

## 2019-11-17 MED ORDER — EPHEDRINE SULFATE-NACL 50-0.9 MG/10ML-% IV SOSY
PREFILLED_SYRINGE | INTRAVENOUS | Status: DC | PRN
  Administered 2019-11-17: 10 mg via INTRAVENOUS
  Administered 2019-11-17: 15 mg via INTRAVENOUS
  Administered 2019-11-17: 10 mg via INTRAVENOUS

## 2019-11-17 MED ORDER — PROPOFOL 10 MG/ML IV BOLUS
INTRAVENOUS | Status: AC
  Filled 2019-11-17: qty 40

## 2019-11-17 MED ORDER — LIDOCAINE HCL 1 % IJ SOLN
INTRAVENOUS | Status: DC | PRN
  Administered 2019-11-17: 500 mL

## 2019-11-17 MED ORDER — SODIUM CHLORIDE 0.9% FLUSH: 3.0000 mL | Freq: Two times a day (BID) | INTRAVENOUS | Status: DC

## 2019-11-17 MED ORDER — DEXMEDETOMIDINE HCL 200 MCG/2ML IV SOLN
INTRAVENOUS | Status: DC | PRN
  Administered 2019-11-17: 8 ug via INTRAVENOUS

## 2019-11-17 MED ORDER — FENTANYL CITRATE (PF) 100 MCG/2ML IJ SOLN
INTRAMUSCULAR | Status: AC
  Filled 2019-11-17: qty 2

## 2019-11-17 MED ORDER — BUPIVACAINE HCL (PF) 0.25 % IJ SOLN
INTRAMUSCULAR | Status: DC | PRN
  Administered 2019-11-17: 30 mL

## 2019-11-17 MED ORDER — CEFAZOLIN SODIUM-DEXTROSE 2-4 GM/100ML-% IV SOLN
2.0000 g | INTRAVENOUS | Status: AC
  Administered 2019-11-17: 2 g via INTRAVENOUS

## 2019-11-17 MED ORDER — SODIUM CHLORIDE 0.9 % IV SOLN: 250.0000 mL | INTRAVENOUS | Status: DC | PRN

## 2019-11-17 MED ORDER — FENTANYL CITRATE (PF) 250 MCG/5ML IJ SOLN
INTRAMUSCULAR | Status: DC | PRN
Start: 2019-11-17 — End: 2019-11-17
  Administered 2019-11-17 (×3): 50 ug via INTRAVENOUS

## 2019-11-17 MED ORDER — PROPOFOL 10 MG/ML IV BOLUS
INTRAVENOUS | Status: DC | PRN
  Administered 2019-11-17: 150 mg via INTRAVENOUS

## 2019-11-17 MED ORDER — DEXMEDETOMIDINE (PRECEDEX) IN NS 20 MCG/5ML (4 MCG/ML) IV SYRINGE
PREFILLED_SYRINGE | INTRAVENOUS | Status: AC
  Filled 2019-11-17: qty 5

## 2019-11-17 MED ORDER — EPHEDRINE 5 MG/ML INJ
INTRAVENOUS | Status: AC
  Filled 2019-11-17: qty 10

## 2019-11-17 MED ORDER — LIDOCAINE 2% (20 MG/ML) 5 ML SYRINGE
INTRAMUSCULAR | Status: AC
  Filled 2019-11-17: qty 5

## 2019-11-17 MED ORDER — ONDANSETRON HCL 4 MG/2ML IJ SOLN
INTRAMUSCULAR | Status: DC | PRN
  Administered 2019-11-17: 4 mg via INTRAVENOUS

## 2019-11-17 MED ORDER — LIDOCAINE 2% (20 MG/ML) 5 ML SYRINGE
INTRAMUSCULAR | Status: DC | PRN
  Administered 2019-11-17: 80 mg via INTRAVENOUS

## 2019-11-17 MED ORDER — OXYCODONE HCL 5 MG PO TABS: 5.0000 mg | ORAL_TABLET | ORAL | Status: DC | PRN

## 2019-11-17 MED ORDER — ONDANSETRON HCL 4 MG/2ML IJ SOLN
INTRAMUSCULAR | Status: AC
  Filled 2019-11-17: qty 2

## 2019-11-17 MED ORDER — MIDAZOLAM HCL 2 MG/2ML IJ SOLN
INTRAMUSCULAR | Status: DC | PRN
  Administered 2019-11-17: 2 mg via INTRAVENOUS

## 2019-11-17 MED ORDER — FENTANYL CITRATE (PF) 100 MCG/2ML IJ SOLN
INTRAMUSCULAR | Status: AC
Start: 2019-11-17 — End: ?
  Filled 2019-11-17: qty 2

## 2019-11-17 MED ORDER — PROMETHAZINE HCL 25 MG/ML IJ SOLN: 6.2500 mg | INTRAMUSCULAR | Status: DC | PRN

## 2019-11-17 SURGICAL SUPPLY — 72 items
BAG DECANTER FOR FLEXI CONT (MISCELLANEOUS) IMPLANT
BINDER BREAST LRG (GAUZE/BANDAGES/DRESSINGS) IMPLANT
BINDER BREAST MEDIUM (GAUZE/BANDAGES/DRESSINGS) IMPLANT
BINDER BREAST XLRG (GAUZE/BANDAGES/DRESSINGS) IMPLANT
BINDER BREAST XXLRG (GAUZE/BANDAGES/DRESSINGS) ×3 IMPLANT
BIOPATCH RED 1 DISK 7.0 (GAUZE/BANDAGES/DRESSINGS) IMPLANT
BIOPATCH RED 1IN DISK 7.0MM (GAUZE/BANDAGES/DRESSINGS)
BLADE HEX COATED 2.75 (ELECTRODE) ×3 IMPLANT
BLADE KNIFE PERSONA 10 (BLADE) ×6 IMPLANT
BLADE SURG 15 STRL LF DISP TIS (BLADE) IMPLANT
BLADE SURG 15 STRL SS (BLADE)
BNDG GAUZE ELAST 4 BULKY (GAUZE/BANDAGES/DRESSINGS) IMPLANT
CANISTER SUCT 1200ML W/VALVE (MISCELLANEOUS) ×3 IMPLANT
COVER BACK TABLE 60X90IN (DRAPES) ×3 IMPLANT
COVER MAYO STAND STRL (DRAPES) ×3 IMPLANT
COVER WAND RF STERILE (DRAPES) IMPLANT
DECANTER SPIKE VIAL GLASS SM (MISCELLANEOUS) ×3 IMPLANT
DERMABOND ADVANCED (GAUZE/BANDAGES/DRESSINGS) ×4
DERMABOND ADVANCED .7 DNX12 (GAUZE/BANDAGES/DRESSINGS) ×2 IMPLANT
DRAIN CHANNEL 19F RND (DRAIN) IMPLANT
DRAPE LAPAROSCOPIC ABDOMINAL (DRAPES) ×3 IMPLANT
DRSG OPSITE POSTOP 4X6 (GAUZE/BANDAGES/DRESSINGS) ×6 IMPLANT
DRSG PAD ABDOMINAL 8X10 ST (GAUZE/BANDAGES/DRESSINGS) ×12 IMPLANT
ELECT BLADE 4.0 EZ CLEAN MEGAD (MISCELLANEOUS)
ELECT REM PT RETURN 9FT ADLT (ELECTROSURGICAL) ×3
ELECTRODE BLDE 4.0 EZ CLN MEGD (MISCELLANEOUS) IMPLANT
ELECTRODE REM PT RTRN 9FT ADLT (ELECTROSURGICAL) ×1 IMPLANT
EVACUATOR SILICONE 100CC (DRAIN) IMPLANT
GLOVE BIO SURGEON STRL SZ 6.5 (GLOVE) ×4 IMPLANT
GLOVE BIO SURGEON STRL SZ7 (GLOVE) ×3 IMPLANT
GLOVE BIO SURGEONS STRL SZ 6.5 (GLOVE) ×2
GLOVE BIOGEL PI IND STRL 7.0 (GLOVE) ×1 IMPLANT
GLOVE BIOGEL PI INDICATOR 7.0 (GLOVE) ×2
GOWN STRL REUS W/ TWL LRG LVL3 (GOWN DISPOSABLE) ×3 IMPLANT
GOWN STRL REUS W/ TWL XL LVL3 (GOWN DISPOSABLE) ×1 IMPLANT
GOWN STRL REUS W/TWL LRG LVL3 (GOWN DISPOSABLE) ×6
GOWN STRL REUS W/TWL XL LVL3 (GOWN DISPOSABLE) ×2
NDL SAFETY ECLIPSE 18X1.5 (NEEDLE) IMPLANT
NEEDLE HYPO 18GX1.5 SHARP (NEEDLE)
NEEDLE HYPO 25X1 1.5 SAFETY (NEEDLE) ×3 IMPLANT
NS IRRIG 1000ML POUR BTL (IV SOLUTION) ×3 IMPLANT
PACK BASIN DAY SURGERY FS (CUSTOM PROCEDURE TRAY) ×3 IMPLANT
PAD ALCOHOL SWAB (MISCELLANEOUS) IMPLANT
PAD FOAM SILICONE BACKED (GAUZE/BANDAGES/DRESSINGS) IMPLANT
PENCIL SMOKE EVACUATOR (MISCELLANEOUS) ×3 IMPLANT
SLEEVE SCD COMPRESS KNEE MED (MISCELLANEOUS) ×3 IMPLANT
SPONGE LAP 18X18 RF (DISPOSABLE) ×12 IMPLANT
STRIP SUTURE WOUND CLOSURE 1/2 (MISCELLANEOUS) ×12 IMPLANT
SUT MNCRL AB 4-0 PS2 18 (SUTURE) ×12 IMPLANT
SUT MON AB 3-0 SH 27 (SUTURE) ×4
SUT MON AB 3-0 SH27 (SUTURE) ×2 IMPLANT
SUT MON AB 5-0 PS2 18 (SUTURE) ×6 IMPLANT
SUT PDS 3-0 CT2 (SUTURE)
SUT PDS AB 2-0 CT2 27 (SUTURE) IMPLANT
SUT PDS II 3-0 CT2 27 ABS (SUTURE) IMPLANT
SUT SILK 3 0 PS 1 (SUTURE) IMPLANT
SUT VIC AB 3-0 SH 27 (SUTURE)
SUT VIC AB 3-0 SH 27X BRD (SUTURE) IMPLANT
SUT VICRYL 4-0 PS2 18IN ABS (SUTURE) IMPLANT
SYR 3ML 23GX1 SAFETY (SYRINGE) ×3 IMPLANT
SYR 50ML LL SCALE MARK (SYRINGE) IMPLANT
SYR BULB IRRIG 60ML STRL (SYRINGE) ×3 IMPLANT
SYR CONTROL 10ML LL (SYRINGE) ×3 IMPLANT
TAPE MEASURE VINYL STERILE (MISCELLANEOUS) IMPLANT
TOWEL GREEN STERILE FF (TOWEL DISPOSABLE) ×6 IMPLANT
TRAY DSU PREP LF (CUSTOM PROCEDURE TRAY) ×3 IMPLANT
TUBE CONNECTING 20'X1/4 (TUBING) ×1
TUBE CONNECTING 20X1/4 (TUBING) ×2 IMPLANT
TUBING INFILTRATION IT-10001 (TUBING) ×3 IMPLANT
TUBING SET GRADUATE ASPIR 12FT (MISCELLANEOUS) ×3 IMPLANT
UNDERPAD 30X36 HEAVY ABSORB (UNDERPADS AND DIAPERS) ×6 IMPLANT
YANKAUER SUCT BULB TIP NO VENT (SUCTIONS) ×3 IMPLANT

## 2019-11-17 NOTE — Anesthesia Procedure Notes (Signed)
Procedure Name: Intubation Date/Time: 11/17/2019 10:42 AM Performed by: Modena Morrow, CRNA Pre-anesthesia Checklist: Patient identified, Emergency Drugs available, Suction available and Patient being monitored Patient Re-evaluated:Patient Re-evaluated prior to induction Oxygen Delivery Method: Circle system utilized Preoxygenation: Pre-oxygenation with 100% oxygen Induction Type: IV induction Ventilation: Mask ventilation without difficulty Laryngoscope Size: Miller and 3 Grade View: Grade II Tube type: Oral Tube size: 7.0 mm Number of attempts: 1 Airway Equipment and Method: Stylet and Oral airway Placement Confirmation: ETT inserted through vocal cords under direct vision,  positive ETCO2 and breath sounds checked- equal and bilateral Secured at: 21 cm Tube secured with: Tape Dental Injury: Teeth and Oropharynx as per pre-operative assessment

## 2019-11-17 NOTE — Interval H&P Note (Signed)
History and Physical Interval Note:  11/17/2019 9:59 AM  Heidi Jordan  has presented today for surgery, with the diagnosis of mammary hypertrophy.  The various methods of treatment have been discussed with the patient and family. After consideration of risks, benefits and other options for treatment, the patient has consented to  Procedure(s): BREAST REDUCTION WITH LIPOSUCTION (Bilateral) as a surgical intervention.  The patient's history has been reviewed, patient examined, no change in status, stable for surgery.  I have reviewed the patient's chart and labs.  Questions were answered to the patient's satisfaction.     Alena Bills [name]

## 2019-11-17 NOTE — Anesthesia Postprocedure Evaluation (Signed)
Anesthesia Post Note  Patient: Heidi Jordan  Procedure(s) Performed: BREAST REDUCTION WITH LIPOSUCTION (Bilateral Breast)     Patient location during evaluation: PACU Anesthesia Type: General Level of consciousness: awake and alert Pain management: pain level controlled Vital Signs Assessment: post-procedure vital signs reviewed and stable Respiratory status: spontaneous breathing, nonlabored ventilation and respiratory function stable Cardiovascular status: blood pressure returned to baseline and stable Postop Assessment: no apparent nausea or vomiting Anesthetic complications: no   No complications documented.  Last Vitals:  Vitals:   11/17/19 1345 11/17/19 1400  BP: 132/89 (!) 139/97  Pulse: 87 94  Resp: 12 18  Temp:  37.1 C  SpO2: 95% 94%    Last Pain:  Vitals:   11/17/19 1400  TempSrc:   PainSc: 5                  Lowella Curb

## 2019-11-17 NOTE — Transfer of Care (Signed)
Immediate Anesthesia Transfer of Care Note  Patient: Heidi Jordan  Procedure(s) Performed: BREAST REDUCTION WITH LIPOSUCTION (Bilateral Breast)  Patient Location: PACU  Anesthesia Type:General  Level of Consciousness: drowsy and patient cooperative  Airway & Oxygen Therapy: Patient Spontanous Breathing and Patient connected to face mask oxygen  Post-op Assessment: Report given to RN and Post -op Vital signs reviewed and stable  Post vital signs: Reviewed and stable  Last Vitals:  Vitals Value Taken Time  BP 140/91 11/17/19 1302  Temp 36.2 C 11/17/19 1302  Pulse 102 11/17/19 1307  Resp 20 11/17/19 1307  SpO2 97 % 11/17/19 1307  Vitals shown include unvalidated device data.  Last Pain:  Vitals:   11/17/19 1302  TempSrc:   PainSc: (P) 0-No pain         Complications: No complications documented.

## 2019-11-17 NOTE — Discharge Instructions (Signed)
INSTRUCTIONS FOR AFTER SURGERY   You will likely have some questions about what to expect following your operation.  The following information will help you and your family understand what to expect when you are discharged from the hospital.  Following these guidelines will help ensure a smooth recovery and reduce risks of complications.  Postoperative instructions include information on: diet, wound care, medications and physical activity.  AFTER SURGERY Expect to go home after the procedure.  In some cases, you may need to spend one night in the hospital for observation.  DIET This surgery does not require a specific diet.  However, I have to mention that the healthier you eat the better your body can start healing. It is important to increasing your protein intake.  This means limiting the foods with added sugar.  Focus on fruits and vegetables and some meat. It is very important to drink water after your surgery.  If your urine is bright yellow, then it is concentrated, and you need to drink more water.  As a general rule after surgery, you should have 8 ounces of water every hour while awake.  If you find you are persistently nauseated or unable to take in liquids let Heidi Jordan know.  NO TOBACCO USE or EXPOSURE.  This will slow your healing process and increase the risk of a wound.  WOUND CARE If you don't have a drain: You can shower the day after surgery.  Use fragrance free soap.  Dial, Dove, [place] and Cetaphil are usually mild on the skin.  If you have steri-strips / tape directly attached to your skin leave them in place. It is OK to get these wet.  No baths, pools or hot tubs for two weeks. We close your incision to leave the smallest and best-looking scar. No ointment or creams on your incisions until given the go ahead.  Especially not Neosporin (Too many skin reactions with this one).  A few weeks after surgery you can use Mederma and start massaging the scar. We ask you to wear your binder or  sports bra for the first 6 weeks around the clock, including while sleeping. This provides added comfort and helps reduce the fluid accumulation at the surgery site.  ACTIVITY No heavy lifting until cleared by the doctor.  It is OK to walk and climb stairs. In fact, moving your legs is very important to decrease your risk of a blood clot.  It will also help keep you from getting deconditioned.  Every 1 to 2 hours get up and walk for 5 minutes. This will help with a quicker recovery back to normal.  Let pain be your guide so you don't do too much.  NO, you cannot do the spring cleaning and don't plan on taking care of anyone else.  This is your time for TLC.   WORK Everyone returns to work at different times. As a rough guide, most people take at least 1 - 2 weeks off prior to returning to work. If you need documentation for your job, bring the forms to your postoperative follow up visit.  DRIVING Arrange for someone to bring you home from the hospital.  You may be able to drive a few days after surgery but not while taking any narcotics or valium.  BOWEL MOVEMENTS Constipation can occur after anesthesia and while taking pain medication.  It is important to stay ahead for your comfort.  We recommend taking Milk of Magnesia (2 tablespoons; twice a day) while taking  the pain pills.  SEROMA This is fluid your body tried to put in the surgical site.  This is normal but if it creates excessive pain and swelling let Heidi Jordan know.  It usually decreases in a few weeks.  MEDICATIONS and PAIN CONTROL At your preoperative visit for you history and physical you were given the following medications: 1. An antibiotic: Start this medication when you get home and take according to the instructions on the bottle. 2. Zofran 4 mg:  This is to treat nausea and vomiting.  You can take this every 6 hours as needed and only if needed. 3. Norco (hydrocodone/acetaminophen) 5/325 mg:  This is only to be used after you have  taken the motrin or the tylenol. Every 8 hours as needed. Over the counter Medication to take: 4. Ibuprofen (Motrin) 600 mg:  Take this every 6 hours.  If you have additional pain then take 500 mg of the tylenol.  Only take the Norco after you have tried these two. 5. Miralax or stool softener of choice: Take this according to the bottle if you take the Norco.  WHEN TO CALL Call your surgeon's office if any of the following occur:  Fever 101 degrees F or greater  Excessive bleeding or fluid from the incision site.  Pain that increases over time without aid from the medications  Redness, warmth, or pus draining from incision sites  Persistent nausea or inability to take in liquids  Severe misshapen area that underwent the operation.   Post Anesthesia Home Care Instructions  Activity: Get plenty of rest for the remainder of the day. A responsible individual must stay with you for 24 hours following the procedure.  For the next 24 hours, DO NOT: -Drive a car -Advertising copywriter -Drink alcoholic beverages -Take any medication unless instructed by your physician -Make any legal decisions or sign important papers.  Meals: Start with liquid foods such as gelatin or soup. Progress to regular foods as tolerated. Avoid greasy, spicy, heavy foods. If nausea and/or vomiting occur, drink only clear liquids until the nausea and/or vomiting subsides. Call your physician if vomiting continues.  Special Instructions/Symptoms: Your throat may feel dry or sore from the anesthesia or the breathing tube placed in your throat during surgery. If this causes discomfort, gargle with warm salt water. The discomfort should disappear within 24 hours.

## 2019-11-17 NOTE — Op Note (Signed)
Breast Reduction Op note:    DATE OF PROCEDURE: 11/17/2019  LOCATION: [facility]  SURGEON: Alan Ripper Sanger Selda Jalbert, DO  ASSISTANT: Keenan Bachelor, PA  PREOPERATIVE DIAGNOSIS 1. Macromastia 2. Neck Pain 3. Back Pain  POSTOPERATIVE DIAGNOSIS 1. Macromastia 2. Neck Pain 3. Back Pain  PROCEDURES 1. Bilateral breast reduction.  Right reduction 798 g, Left reduction 742 g  COMPLICATIONS: None.  DRAINS: none  INDICATIONS FOR PROCEDURE Heidi Jordan is a 54 y.o. year-old female born on Feb 01, 1965,with a history of symptomatic macromastia with concominant back pain, neck pain, shoulder grooving from her bra.   MRN: [id number]  CONSENT Informed consent was obtained directly from the patient. The risks, benefits and alternatives were fully discussed. Specific risks including but not limited to bleeding, infection, hematoma, seroma, scarring, pain, nipple necrosis, asymmetry, poor cosmetic results, and need for further surgery were discussed. The patient had ample opportunity to have her questions answered to her satisfaction.  DESCRIPTION OF PROCEDURE  Patient was brought into the operating room and placed in a supine position.  SCDs were placed and appropriate padding was performed.  Antibiotics were given. The patient underwent general anesthesia and the chest was prepped and draped in a sterile fashion.  A timeout was performed and all information was confirmed to be correct. Tumescent was infused in the inferior and lateral portion of each breast.   Right side: Preoperative markings were confirmed.  Liposuction was done on the lateral and inferior portion of the breast.  Incision lines were injected with 1% Xylocaine with epinephrine.  After waiting for vasoconstriction, the marked lines were incised.  A Wise-pattern superomedial breast reduction was performed by de-epithelializing the pedicle, using bovie to create the superomedial pedicle, and removing  breast tissue from the superior, lateral, and inferior portions of the breast.  Care was taken to not undermine the breast pedicle. Hemostasis was achieved.  The nipple was gently rotated into position and the soft tissue closed with 4-0 Monocryl.   The pocket was irrigated and hemostasis confirmed.  The deep tissues were approximated with 3-0 Monocryl sutures and the skin was closed with deep dermal and subcuticular 4-0 Monocryl sutures.  The nipple and skin flaps had good capillary refill at the end of the procedure.    Left side: Preoperative markings were confirmed.  Liposuction was done on the lateral and inferior portion of the breast.  Incision lines were injected with 1% Xylocaine with epinephrine.  After waiting for vasoconstriction, the marked lines were incised.  A Wise-pattern superomedial breast reduction was performed by de-epithelializing the pedicle, using bovie to create the superomedial pedicle, and removing breast tissue from the superior, lateral, and inferior portions of the breast.  Care was taken to not undermine the breast pedicle. Hemostasis was achieved.  The nipple was gently rotated into position and the soft tissue was closed with 4-0 Monocryl.  The patient was sat upright and size and shape symmetry was confirmed.  The pocket was irrigated and hemostasis confirmed.  The deep tissues were approximated with 3-0 Monocryl sutures and the skin was closed with deep dermal and subcuticular 4-0 Monocryl sutures.  Dermabond was applied.  A breast binder and ABDs were placed.  The nipple and skin flaps had good capillary refill at the end of the procedure.  The patient tolerated the procedure well. The patient was allowed to wake from anesthesia and taken to the recovery room in satisfactory condition.  The advanced practice practitioner (APP) assisted throughout the case.  The APP was essential in retraction and counter traction when needed to make the case progress smoothly.  This  retraction and assistance made it possible to see the tissue plans for the procedure.  The assistance was needed for blood control, tissue re-approximation and assisted with closure of the incision site.

## 2019-11-17 NOTE — Anesthesia Preprocedure Evaluation (Signed)
Anesthesia Evaluation  Patient identified by MRN, date of birth, ID band Patient awake    Reviewed: Allergy & Precautions, H&P , NPO status , Patient's Chart, lab work & pertinent test results, reviewed documented beta blocker date and time   History of Anesthesia Complications (+) history of anesthetic complications (epidurals didn't work)  Airway Mallampati: I  TM Distance: >3 FB Neck ROM: full    Dental no notable dental hx. (+) Teeth Intact   Pulmonary neg pulmonary ROS,    Pulmonary exam normal breath sounds clear to auscultation       Cardiovascular Exercise Tolerance: Good negative cardio ROS Normal cardiovascular exam Rhythm:regular Rate:Normal     Neuro/Psych  Headaches, negative psych ROS   GI/Hepatic Neg liver ROS, GERD  Medicated,  Endo/Other  negative endocrine ROS  Renal/GU negative Renal ROS  negative genitourinary   Musculoskeletal   Abdominal (+) + obese,   Peds  Hematology negative hematology ROS (+)   Anesthesia Other Findings   Reproductive/Obstetrics negative OB ROS                             Anesthesia Physical  Anesthesia Plan  ASA: II  Anesthesia Plan: General   Post-op Pain Management:    Induction: Intravenous  PONV Risk Score and Plan: 3 and Ondansetron, Dexamethasone, Midazolam and Treatment may vary due to age or medical condition  Airway Management Planned: Oral ETT  Additional Equipment:   Intra-op Plan:   Post-operative Plan: Extubation in OR  Informed Consent: I have reviewed the patients History and Physical, chart, labs and discussed the procedure including the risks, benefits and alternatives for the proposed anesthesia with the patient or authorized representative who has indicated his/her understanding and acceptance.     Dental Advisory Given  Plan Discussed with: CRNA and Surgeon  Anesthesia Plan Comments:          Anesthesia Quick Evaluation

## 2019-11-18 ENCOUNTER — Encounter (HOSPITAL_BASED_OUTPATIENT_CLINIC_OR_DEPARTMENT_OTHER): Payer: Self-pay | Admitting: Plastic Surgery

## 2019-11-18 LAB — SURGICAL PATHOLOGY

## 2019-11-19 ENCOUNTER — Ambulatory Visit: Payer: Self-pay | Admitting: Surgery

## 2019-11-19 NOTE — H&P (Signed)
Surgical Evaluation  Chief Complaint: lap band  HPI: this is a very pleasant 54 year old woman who had a 10cm lap band placed n March 2005 in [place].  She has done well with this with the exception of chronic postprandial nausea and vomiting.  I saw her about 3 years ago and remove the fluid from her band for this reason.  Subsequently she did regain some weight and came back to have lap band fill left ear.  Although she is doing well, she was recently diagnosed with Barrett's esophagus despite being on omeprazole religiously for many years.  She has a family history of cancer of multiple sites, and specifically esophageal cancer in her father.  She wishes to have the lap band removed.  She is currently scheduled for panniculectomy with Dr. Ulice Bold and is hopeful that both procedures can be done in the same encounter.  Allergies  Allergen Reactions  . Sulfa Drugs Cross Reactors Other (See Comments)    casues migraine headaches    Past Medical History:  Diagnosis Date  . Anemia   . Chronic back pain    tx with ibuprofen  . Chronic tension headaches   . External hemorrhoids   . GERD (gastroesophageal reflux disease)   . Hiatal hernia   . Insomnia    tx with amitriptyline  . Internal hemorrhoids   . Iron deficiency   . Laryngopharyngeal reflux   . Missed abortion    no surgery reuqired  . Obesity     Past Surgical History:  Procedure Laterality Date  . BREAST REDUCTION SURGERY Bilateral 11/17/2019   Procedure: BREAST REDUCTION WITH LIPOSUCTION;  Surgeon: Peggye Form, DO;  Location: [facility];  Service: Plastics;  Laterality: Bilateral;  . CESAREAN SECTION     x 2  . COLONOSCOPY    . DIAGNOSTIC LAPAROSCOPY     x 2 surg,for ectopic preg w/ removal of tube  . DIAGNOSTIC LAPAROSCOPY     endometriosis - removed tube  . fibroid tumor removal    . HYSTEROSCOPY WITH THERMACHOICE  06/14/2011   Procedure: HYSTEROSCOPY WITH THERMACHOICE;  Surgeon: Geryl Rankins, MD;  Location: [facility];  Service: Gynecology;;  Therma choice started at 1413  . Lap Band surgery  2004  . UPPER GASTROINTESTINAL ENDOSCOPY    . WISDOM TOOTH EXTRACTION      Family History  Problem Relation Age of Onset  . Breast cancer Maternal Grandmother   . Colon cancer Mother        in her 40s  . Renal cancer Mother   . Colon polyps Mother   . Hypertension Father   . Skin cancer Father   . Lymphoma Father   . Esophageal cancer Father   . Stomach cancer Maternal Grandfather   . Brain cancer Paternal Grandmother   . Esophageal cancer Paternal Grandfather   . Rectal cancer Neg Hx     Social History   Socioeconomic History  . Marital status: Married    Spouse name: Not on file  . Number of children: Not on file  . Years of education: Not on file  . Highest education level: Not on file  Occupational History  . Not on file  Tobacco Use  . Smoking status: Never Smoker  . Smokeless tobacco: Never Used  Substance and Sexual Activity  . Alcohol use: Yes    Comment: socially  . Drug use: No  . Sexual activity: Yes    Birth control/protection: None  Other Topics Concern  .  Not on file  Social History Narrative  . Not on file   Social Determinants of Health   Financial Resource Strain:   . Difficulty of Paying Living Expenses: Not on file  Food Insecurity:   . Worried About Programme researcher, broadcasting/film/video in the Last Year: Not on file  . Ran Out of Food in the Last Year: Not on file  Transportation Needs:   . Lack of Transportation (Medical): Not on file  . Lack of Transportation (Non-Medical): Not on file  Physical Activity:   . Days of Exercise per Week: Not on file  . Minutes of Exercise per Session: Not on file  Stress:   . Feeling of Stress : Not on file  Social Connections:   . Frequency of Communication with Friends and Family: Not on file  . Frequency of Social Gatherings with Friends and Family: Not on file  . Attends Religious Services: Not on file  .  Active Member of Clubs or Organizations: Not on file  . Attends Banker Meetings: Not on file  . Marital Status: Not on file    Current Outpatient Medications on File Prior to Visit  Medication Sig Dispense Refill  . amitriptyline (ELAVIL) 50 MG tablet Take 50 mg by mouth at bedtime.    Marland Kitchen omeprazole (PRILOSEC) 40 MG capsule Take 40 mg by mouth daily.    . ondansetron (ZOFRAN) 4 MG tablet Take 1 tablet (4 mg total) by mouth every 8 (eight) hours as needed for nausea or vomiting. 20 tablet 0  . traMADol (ULTRAM) 50 MG tablet TAKE 1 TABLET BY MOUTH EVERY 6 HOURS AS NEEDED 10 tablet 0   Current Facility-Administered Medications on File Prior to Visit  Medication Dose Route Frequency Provider Last Rate Last Admin  . 0.9 %  sodium chloride infusion  500 mL Intravenous Once Pyrtle, Carie Caddy, MD        Review of Systems: a complete, 10pt review of systems was completed with pertinent positives and negatives as documented in the HPI  Physical Exam: Vitals  Weight: 201 lb Height: 63in Body Surface Area: 1.94 m Body Mass Index: 35.61 kg/m  Temp.: 98.93F  Pulse: 102 (Regular)  BP: 132/80(Sitting, Left Arm, Standard)  Alert and well-appearing. On the respirations. Abdomen soft and nontender   CBC Latest Ref Rng & Units 07/18/2017 03/13/2014 06/14/2011  WBC 4.0 - 10.5 K/uL 5.1 3.3(L) 4.4  Hemoglobin 12.0 - 15.0 g/dL 16.6 06.0 04.5  Hematocrit 36 - 46 % 38.5 37.5 39.5  Platelets 150 - 400 K/uL 277.0 234 230    CMP Latest Ref Rng & Units 03/13/2014  Glucose 70 - 99 mg/dL 85  BUN 6 - 23 mg/dL 14  Creatinine 9.97 - 7.41 mg/dL 4.23  Sodium 953 - 202 mmol/L 135  Potassium 3.5 - 5.1 mmol/L 4.0  Chloride 96 - 112 mmol/L 103  CO2 19 - 32 mmol/L 26  Calcium 8.4 - 10.5 mg/dL 8.6    No results found for: INR, PROTIME  Imaging: No results found.   A/P: BARRETT'S ESOPHAGUS (K22.70) Story: She was recently diagnosed with this, despite being on omeprazole for years and  this can likely be partially attributed to reflux secondary to the lap band. She also does not want to deal with the chronic nausea and vomiting that she has from the band anymore which I think is reasonable. She would like the lap band removed and I think that that is appropriate and necessary given her recent diagnosis  and family history. We discussed the procedure and risks of bleeding, infection, pain, scarring, injury to intra-abdominal structures, esophagus or stomach, need for drain placement, and risk of postoperative weight regain. Questions were answered. We will try to coordinate this case with Dr. Kittie Plater planned panniculectomy.    Patient Active Problem List   Diagnosis Date Noted  . Back pain 08/26/2019  . Neck pain 08/26/2019  . Symptomatic mammary hypertrophy 08/26/2019  . Panniculitis 08/26/2019       Phylliss Blakes, MD [facility]  See AMION to contact appropriate on-call provider

## 2019-11-25 ENCOUNTER — Encounter: Payer: Self-pay | Admitting: Plastic Surgery

## 2019-11-25 ENCOUNTER — Other Ambulatory Visit: Payer: Self-pay

## 2019-11-25 ENCOUNTER — Ambulatory Visit (INDEPENDENT_AMBULATORY_CARE_PROVIDER_SITE_OTHER): Payer: No Typology Code available for payment source | Admitting: Plastic Surgery

## 2019-11-25 VITALS — BP 130/80 | HR 82 | Temp 97.8°F

## 2019-11-25 DIAGNOSIS — N62 Hypertrophy of breast: Secondary | ICD-10-CM

## 2019-11-25 NOTE — Progress Notes (Signed)
   Subjective:    Patient ID: Heidi Jordan, female    DOB: [birth year], 54 y.o.   MRN: [id number]  The patient is a 54 yrs old wf here for follow up after undergoing a bilateral breast reduction.  She had ~ 700 gm removed from each breast.  The incisions are closed and healing.  The dressing is in place.  No sign of infection.  There may be a small seroma on the right.  We were not able to drain it.       Review of Systems  Constitutional: Positive for activity change.  HENT: Negative.   Eyes: Negative.   Respiratory: Negative.   Cardiovascular: Negative.   Genitourinary: Negative.        Objective:   Physical Exam Vitals and nursing note reviewed.  Constitutional:      Appearance: Normal appearance.  HENT:     Head: Normocephalic and atraumatic.  Cardiovascular:     Rate and Rhythm: Normal rate.  Pulmonary:     Effort: Pulmonary effort is normal.  Neurological:     General: No focal deficit present.     Mental Status: She is alert. Mental status is at baseline.  Psychiatric:        Mood and Affect: Mood normal.        Behavior: Behavior normal.           Assessment & Plan:     ICD-10-CM   1. Symptomatic mammary hypertrophy  N62     Continue the sports bra or binder.  Plan to see patient in 1-2 weeks for follow up.

## 2019-11-26 ENCOUNTER — Encounter: Payer: Self-pay | Admitting: Plastic Surgery

## 2019-12-09 ENCOUNTER — Ambulatory Visit: Payer: No Typology Code available for payment source | Admitting: Surgical

## 2019-12-09 ENCOUNTER — Encounter: Payer: Self-pay | Admitting: Plastic Surgery

## 2019-12-09 ENCOUNTER — Ambulatory Visit (INDEPENDENT_AMBULATORY_CARE_PROVIDER_SITE_OTHER): Payer: No Typology Code available for payment source | Admitting: Plastic Surgery

## 2019-12-09 ENCOUNTER — Other Ambulatory Visit: Payer: Self-pay

## 2019-12-09 VITALS — BP 127/87 | HR 79 | Temp 98.6°F

## 2019-12-09 DIAGNOSIS — N62 Hypertrophy of breast: Secondary | ICD-10-CM

## 2019-12-09 NOTE — Progress Notes (Signed)
The patient is doing extremely well from her bilateral breast reduction.  I remove the honeycomb dressing.  I replaced some of the Steri-Strips.  The patient can continue showering and going to a sports bra.  Increase activity slowly.  Let the Steri-Strips fall off when the are ready.  She can trim them as they start to peel back.  I had like to see her back in 2 weeks.

## 2019-12-23 ENCOUNTER — Ambulatory Visit (INDEPENDENT_AMBULATORY_CARE_PROVIDER_SITE_OTHER): Payer: No Typology Code available for payment source | Admitting: Surgical

## 2019-12-23 ENCOUNTER — Other Ambulatory Visit: Payer: Self-pay

## 2019-12-23 ENCOUNTER — Other Ambulatory Visit: Payer: Self-pay | Admitting: Surgical

## 2019-12-23 ENCOUNTER — Encounter: Payer: Self-pay | Admitting: Surgical

## 2019-12-23 VITALS — BP 153/105 | HR 94 | Temp 98.9°F | Ht 62.0 in | Wt 198.4 lb

## 2019-12-23 DIAGNOSIS — M793 Panniculitis, unspecified: Secondary | ICD-10-CM

## 2019-12-23 MED ORDER — HYDROCODONE-ACETAMINOPHEN 5-325 MG PO TABS
1.0000 | ORAL_TABLET | Freq: Four times a day (QID) | ORAL | 0 refills | Status: DC | PRN
Start: 2019-12-23 — End: 2019-12-23

## 2019-12-23 MED ORDER — CEPHALEXIN 500 MG PO CAPS: 500.0000 mg | ORAL_CAPSULE | Freq: Four times a day (QID) | ORAL | 0 refills | Status: DC

## 2019-12-23 MED FILL — CEPHALEXIN 500 MG CAPSULE: 500 | 3 days supply | Qty: 12 | Fill #0

## 2019-12-23 MED FILL — HYDROCODON-APAP 5-325: 5-325 | 5 days supply | Qty: 20 | Fill #0

## 2019-12-23 NOTE — H&P (View-Only) (Signed)
Patient ID: Heidi Jordan, female    DOB: [birth year], 54 y.o.   MRN: [id number]  No chief complaint on file.     ICD-10-CM   1. Panniculitis  M79.3      History of Present Illness: Heidi Jordan is a 54 y.o.  female  with a history of panniculitis.  She presents for preoperative evaluation for upcoming procedure, panniculectomy, scheduled for 01/07/2020 with Dr. Ulice Bold  The patient has not had problems with anesthesia. No history of DVT/PE.  No family history of DVT/PE.  No family or personal history of bleeding or clotting disorders.  Patient is not currently taking any blood thinners.  No history of CVA/MI.   Patient postop from bilateral breast reduction on 11/17/2019, she reports she is doing well, does have a little bit of tenderness within each lateral breast but is otherwise doing well.  She reports that her back pain has significantly improved after the reduction, she does still occasionally take tramadol as needed for back pain.  Job: Garment/textile technologist for [employer]  PMH Significant for: Chronic back pain, anemia, GERD, iron deficiency.   Past Medical History: Allergies: Allergies  Allergen Reactions  . Sulfa Drugs Cross Reactors Other (See Comments)    casues migraine headaches    Current Medications:  Current Outpatient Medications:  .  amitriptyline (ELAVIL) 50 MG tablet, Take 50 mg by mouth at bedtime., Disp: , Rfl:  .  omeprazole (PRILOSEC) 40 MG capsule, Take 40 mg by mouth daily., Disp: , Rfl:  .  ondansetron (ZOFRAN) 4 MG tablet, Take 1 tablet (4 mg total) by mouth every 8 (eight) hours as needed for nausea or vomiting., Disp: 20 tablet, Rfl: 0 .  traMADol (ULTRAM) 50 MG tablet, TAKE 1 TABLET BY MOUTH EVERY 6 HOURS AS NEEDED, Disp: 10 tablet, Rfl: 0  Current Facility-Administered Medications:  .  0.9 %  sodium chloride infusion, 500 mL, Intravenous, Once, Pyrtle, Carie Caddy, MD  Past Medical Problems: Past Medical History:  Diagnosis Date  . Anemia    . Chronic back pain    tx with ibuprofen  . Chronic tension headaches   . External hemorrhoids   . GERD (gastroesophageal reflux disease)   . Hiatal hernia   . Insomnia    tx with amitriptyline  . Internal hemorrhoids   . Iron deficiency   . Laryngopharyngeal reflux   . Missed abortion    no surgery reuqired  . Obesity     Past Surgical History: Past Surgical History:  Procedure Laterality Date  . BREAST REDUCTION SURGERY Bilateral 11/17/2019   Procedure: BREAST REDUCTION WITH LIPOSUCTION;  Surgeon: Peggye Form, DO;  Location: [facility];  Service: Plastics;  Laterality: Bilateral;  . CESAREAN SECTION     x 2  . COLONOSCOPY    . DIAGNOSTIC LAPAROSCOPY     x 2 surg,for ectopic preg w/ removal of tube  . DIAGNOSTIC LAPAROSCOPY     endometriosis - removed tube  . fibroid tumor removal    . HYSTEROSCOPY WITH THERMACHOICE  06/14/2011   Procedure: HYSTEROSCOPY WITH THERMACHOICE;  Surgeon: Geryl Rankins, MD;  Location: [facility];  Service: Gynecology;;  Therma choice started at 1413  . Lap Band surgery  2004  . UPPER GASTROINTESTINAL ENDOSCOPY    . WISDOM TOOTH EXTRACTION      Social History: Social History   Socioeconomic History  . Marital status: Married    Spouse name: Not on file  . Number of  children: Not on file  . Years of education: Not on file  . Highest education level: Not on file  Occupational History  . Not on file  Tobacco Use  . Smoking status: Never Smoker  . Smokeless tobacco: Never Used  Substance and Sexual Activity  . Alcohol use: Yes    Comment: socially  . Drug use: No  . Sexual activity: Yes    Birth control/protection: None  Other Topics Concern  . Not on file  Social History Narrative  . Not on file   Social Determinants of Health   Financial Resource Strain:   . Difficulty of Paying Living Expenses: Not on file  Food Insecurity:   . Worried About Programme researcher, broadcasting/film/video in the Last Year: Not on file  . Ran Out  of Food in the Last Year: Not on file  Transportation Needs:   . Lack of Transportation (Medical): Not on file  . Lack of Transportation (Non-Medical): Not on file  Physical Activity:   . Days of Exercise per Week: Not on file  . Minutes of Exercise per Session: Not on file  Stress:   . Feeling of Stress : Not on file  Social Connections:   . Frequency of Communication with Friends and Family: Not on file  . Frequency of Social Gatherings with Friends and Family: Not on file  . Attends Religious Services: Not on file  . Active Member of Clubs or Organizations: Not on file  . Attends Banker Meetings: Not on file  . Marital Status: Not on file  Intimate Partner Violence:   . Fear of Current or Ex-Partner: Not on file  . Emotionally Abused: Not on file  . Physically Abused: Not on file  . Sexually Abused: Not on file    Family History: Family History  Problem Relation Age of Onset  . Breast cancer Maternal Grandmother   . Colon cancer Mother        in her 28s  . Renal cancer Mother   . Colon polyps Mother   . Hypertension Father   . Skin cancer Father   . Lymphoma Father   . Esophageal cancer Father   . Stomach cancer Maternal Grandfather   . Brain cancer Paternal Grandmother   . Esophageal cancer Paternal Grandfather   . Rectal cancer Neg Hx     Review of Systems: Review of Systems  Constitutional: Negative.   Eyes: Negative.   Respiratory: Negative.   Cardiovascular: Negative.   Gastrointestinal: Negative.   Genitourinary: Negative.   Musculoskeletal: Positive for back pain.  Neurological: Negative.     Physical Exam: Vital Signs LMP 05/15/2011  Physical Exam  Constitutional:       General: She is not in acute distress.    Appearance: Normal appearance. She is not ill-appearing.  HENT:     Head: Normocephalic and atraumatic.  Eyes:     Pupils: Pupils are equal, round Neck:     Musculoskeletal: Normal range of motion.  Cardiovascular:      Rate and Rhythm: Normal rate and regular rhythm.     Pulses: Normal pulses.     Heart sounds: Normal heart sounds. No murmur.  Pulmonary:     Effort: Pulmonary effort is normal. No respiratory distress.     Breath sounds: Normal breath sounds. No wheezing.  Abdominal:     General: Abdomen is flat. There is no distension.     Palpations: Abdomen is soft.     Tenderness: There  is no abdominal tenderness.  Musculoskeletal: Normal range of motion.  Skin:    General: Skin is warm and dry.     Findings: No erythema or rash.  Neurological:     General: No focal deficit present.     Mental Status: She is alert and oriented to person, place, and time. Mental status is at baseline.     Motor: No weakness.  Psychiatric:        Mood and Affect: Mood normal.        Behavior: Behavior normal.    Assessment/Plan: The patient is scheduled for panniculectomy with Dr. Ulice Bold.  Risks, benefits, and alternatives of procedure discussed, questions answered and consent obtained.    Smoking Status: Non-smoker; Counseling Given?  N/A Last Mammogram: N/A; Results: N/A  Caprini Score: 4, moderate; Risk Factors include: Age, BMI greater than 25, and length of planned surgery. Recommendation for mechanical  prophylaxis . Encourage early ambulation.   Pictures obtained:@Consult   Post-op Rx sent to pharmacy: Norco, Keflex.  Patient does take tramadol as needed for back pain, however she is aware and we discussed today to avoid taking the tramadol while she is taking the Norco for postoperative pain.  Patient agreed and understood.  Patient has prescription of Zofran from previous breast reduction recently.  Patient was provided with the General Surgical Risk consent document and Pain Medication Agreement prior to their appointment.  They had adequate time to read through the risk consent documents and Pain Medication Agreement. We also discussed them in person together during this preop appointment. All of  their questions were answered to their satisfaction.  Recommended calling if they have any further questions.  Risk consent form and Pain Medication Agreement to be scanned into patient's chart.  The risk that can be encountered for this procedure were discussed and include the following but not limited to these: asymmetry, fluid accumulation, firmness of the tissue, skin loss, decrease or no sensation, fat necrosis, bleeding, infection, healing delay.  Deep vein thrombosis, cardiac and pulmonary complications are risks to any procedure.  There are risks of anesthesia, changes to skin sensation and injury to nerves or blood vessels.  The muscle can be temporarily or permanently injured.  You may have an allergic reaction to tape, suture, glue, blood products which can result in skin discoloration, swelling, pain, skin lesions, poor healing.  Any of these can lead to the need for revisonal surgery or stage procedures.  Weight gain and weigh loss can also effect the long term appearance. The results are not guaranteed to last a lifetime.  Future surgery may be required.    We discussed the limitations of a panniculectomy and the difference between a panniculectomy and an abdominoplasty.  All of the patient's questions were answered in regards to the differences.  Patient would like to move forward with panniculectomy.   Electronically signed by: Kermit Balo [place], PA-C 12/23/2019 1:20 PM

## 2019-12-23 NOTE — Progress Notes (Signed)
Patient ID: Heidi Jordan, female    DOB: [birth year], 54 y.o.   MRN: [id number]  No chief complaint on file.     ICD-10-CM   1. Panniculitis  M79.3      History of Present Illness: Heidi Jordan is a 54 y.o.  female  with a history of panniculitis.  She presents for preoperative evaluation for upcoming procedure, panniculectomy, scheduled for 01/07/2020 with Dr. Ulice Bold  The patient has not had problems with anesthesia. No history of DVT/PE.  No family history of DVT/PE.  No family or personal history of bleeding or clotting disorders.  Patient is not currently taking any blood thinners.  No history of CVA/MI.   Patient postop from bilateral breast reduction on 11/17/2019, she reports she is doing well, does have a little bit of tenderness within each lateral breast but is otherwise doing well.  She reports that her back pain has significantly improved after the reduction, she does still occasionally take tramadol as needed for back pain.  Job: Garment/textile technologist for [employer]  PMH Significant for: Chronic back pain, anemia, GERD, iron deficiency.   Past Medical History: Allergies: Allergies  Allergen Reactions  . Sulfa Drugs Cross Reactors Other (See Comments)    casues migraine headaches    Current Medications:  Current Outpatient Medications:  .  amitriptyline (ELAVIL) 50 MG tablet, Take 50 mg by mouth at bedtime., Disp: , Rfl:  .  omeprazole (PRILOSEC) 40 MG capsule, Take 40 mg by mouth daily., Disp: , Rfl:  .  ondansetron (ZOFRAN) 4 MG tablet, Take 1 tablet (4 mg total) by mouth every 8 (eight) hours as needed for nausea or vomiting., Disp: 20 tablet, Rfl: 0 .  traMADol (ULTRAM) 50 MG tablet, TAKE 1 TABLET BY MOUTH EVERY 6 HOURS AS NEEDED, Disp: 10 tablet, Rfl: 0  Current Facility-Administered Medications:  .  0.9 %  sodium chloride infusion, 500 mL, Intravenous, Once, Pyrtle, Carie Caddy, MD  Past Medical Problems: Past Medical History:  Diagnosis Date  . Anemia    . Chronic back pain    tx with ibuprofen  . Chronic tension headaches   . External hemorrhoids   . GERD (gastroesophageal reflux disease)   . Hiatal hernia   . Insomnia    tx with amitriptyline  . Internal hemorrhoids   . Iron deficiency   . Laryngopharyngeal reflux   . Missed abortion    no surgery reuqired  . Obesity     Past Surgical History: Past Surgical History:  Procedure Laterality Date  . BREAST REDUCTION SURGERY Bilateral 11/17/2019   Procedure: BREAST REDUCTION WITH LIPOSUCTION;  Surgeon: Peggye Form, DO;  Location: [facility];  Service: Plastics;  Laterality: Bilateral;  . CESAREAN SECTION     x 2  . COLONOSCOPY    . DIAGNOSTIC LAPAROSCOPY     x 2 surg,for ectopic preg w/ removal of tube  . DIAGNOSTIC LAPAROSCOPY     endometriosis - removed tube  . fibroid tumor removal    . HYSTEROSCOPY WITH THERMACHOICE  06/14/2011   Procedure: HYSTEROSCOPY WITH THERMACHOICE;  Surgeon: Geryl Rankins, MD;  Location: [facility];  Service: Gynecology;;  Therma choice started at 1413  . Lap Band surgery  2004  . UPPER GASTROINTESTINAL ENDOSCOPY    . WISDOM TOOTH EXTRACTION      Social History: Social History   Socioeconomic History  . Marital status: Married    Spouse name: Not on file  . Number of  children: Not on file  . Years of education: Not on file  . Highest education level: Not on file  Occupational History  . Not on file  Tobacco Use  . Smoking status: Never Smoker  . Smokeless tobacco: Never Used  Substance and Sexual Activity  . Alcohol use: Yes    Comment: socially  . Drug use: No  . Sexual activity: Yes    Birth control/protection: None  Other Topics Concern  . Not on file  Social History Narrative  . Not on file   Social Determinants of Health   Financial Resource Strain:   . Difficulty of Paying Living Expenses: Not on file  Food Insecurity:   . Worried About Programme researcher, broadcasting/film/video in the Last Year: Not on file  . Ran Out  of Food in the Last Year: Not on file  Transportation Needs:   . Lack of Transportation (Medical): Not on file  . Lack of Transportation (Non-Medical): Not on file  Physical Activity:   . Days of Exercise per Week: Not on file  . Minutes of Exercise per Session: Not on file  Stress:   . Feeling of Stress : Not on file  Social Connections:   . Frequency of Communication with Friends and Family: Not on file  . Frequency of Social Gatherings with Friends and Family: Not on file  . Attends Religious Services: Not on file  . Active Member of Clubs or Organizations: Not on file  . Attends Banker Meetings: Not on file  . Marital Status: Not on file  Intimate Partner Violence:   . Fear of Current or Ex-Partner: Not on file  . Emotionally Abused: Not on file  . Physically Abused: Not on file  . Sexually Abused: Not on file    Family History: Family History  Problem Relation Age of Onset  . Breast cancer Maternal Grandmother   . Colon cancer Mother        in her 28s  . Renal cancer Mother   . Colon polyps Mother   . Hypertension Father   . Skin cancer Father   . Lymphoma Father   . Esophageal cancer Father   . Stomach cancer Maternal Grandfather   . Brain cancer Paternal Grandmother   . Esophageal cancer Paternal Grandfather   . Rectal cancer Neg Hx     Review of Systems: Review of Systems  Constitutional: Negative.   Eyes: Negative.   Respiratory: Negative.   Cardiovascular: Negative.   Gastrointestinal: Negative.   Genitourinary: Negative.   Musculoskeletal: Positive for back pain.  Neurological: Negative.     Physical Exam: Vital Signs LMP 05/15/2011  Physical Exam  Constitutional:       General: She is not in acute distress.    Appearance: Normal appearance. She is not ill-appearing.  HENT:     Head: Normocephalic and atraumatic.  Eyes:     Pupils: Pupils are equal, round Neck:     Musculoskeletal: Normal range of motion.  Cardiovascular:      Rate and Rhythm: Normal rate and regular rhythm.     Pulses: Normal pulses.     Heart sounds: Normal heart sounds. No murmur.  Pulmonary:     Effort: Pulmonary effort is normal. No respiratory distress.     Breath sounds: Normal breath sounds. No wheezing.  Abdominal:     General: Abdomen is flat. There is no distension.     Palpations: Abdomen is soft.     Tenderness: There  is no abdominal tenderness.  Musculoskeletal: Normal range of motion.  Skin:    General: Skin is warm and dry.     Findings: No erythema or rash.  Neurological:     General: No focal deficit present.     Mental Status: She is alert and oriented to person, place, and time. Mental status is at baseline.     Motor: No weakness.  Psychiatric:        Mood and Affect: Mood normal.        Behavior: Behavior normal.    Assessment/Plan: The patient is scheduled for panniculectomy with Dr. Ulice Bold.  Risks, benefits, and alternatives of procedure discussed, questions answered and consent obtained.    Smoking Status: Non-smoker; Counseling Given?  N/A Last Mammogram: N/A; Results: N/A  Caprini Score: 4, moderate; Risk Factors include: Age, BMI greater than 25, and length of planned surgery. Recommendation for mechanical  prophylaxis . Encourage early ambulation.   Pictures obtained:@Consult   Post-op Rx sent to pharmacy: Norco, Keflex.  Patient does take tramadol as needed for back pain, however she is aware and we discussed today to avoid taking the tramadol while she is taking the Norco for postoperative pain.  Patient agreed and understood.  Patient has prescription of Zofran from previous breast reduction recently.  Patient was provided with the General Surgical Risk consent document and Pain Medication Agreement prior to their appointment.  They had adequate time to read through the risk consent documents and Pain Medication Agreement. We also discussed them in person together during this preop appointment. All of  their questions were answered to their satisfaction.  Recommended calling if they have any further questions.  Risk consent form and Pain Medication Agreement to be scanned into patient's chart.  The risk that can be encountered for this procedure were discussed and include the following but not limited to these: asymmetry, fluid accumulation, firmness of the tissue, skin loss, decrease or no sensation, fat necrosis, bleeding, infection, healing delay.  Deep vein thrombosis, cardiac and pulmonary complications are risks to any procedure.  There are risks of anesthesia, changes to skin sensation and injury to nerves or blood vessels.  The muscle can be temporarily or permanently injured.  You may have an allergic reaction to tape, suture, glue, blood products which can result in skin discoloration, swelling, pain, skin lesions, poor healing.  Any of these can lead to the need for revisonal surgery or stage procedures.  Weight gain and weigh loss can also effect the long term appearance. The results are not guaranteed to last a lifetime.  Future surgery may be required.    We discussed the limitations of a panniculectomy and the difference between a panniculectomy and an abdominoplasty.  All of the patient's questions were answered in regards to the differences.  Patient would like to move forward with panniculectomy.   Electronically signed by: Kermit Balo Amanuel Sinkfield, PA-C 12/23/2019 1:20 PM

## 2019-12-31 ENCOUNTER — Encounter (HOSPITAL_BASED_OUTPATIENT_CLINIC_OR_DEPARTMENT_OTHER): Payer: Self-pay | Admitting: Plastic Surgery

## 2019-12-31 ENCOUNTER — Other Ambulatory Visit: Payer: Self-pay

## 2020-01-01 MED FILL — OMEPRAZOLE 20 MG CAP: 20 | 90 days supply | Qty: 180 | Fill #1

## 2020-01-01 MED FILL — AMITRIPTYLINE HCL 50 MG TAB: 50 | 90 days supply | Qty: 90 | Fill #1

## 2020-01-02 DIAGNOSIS — Z719 Counseling, unspecified: Secondary | ICD-10-CM

## 2020-01-03 ENCOUNTER — Other Ambulatory Visit (HOSPITAL_COMMUNITY): Payer: No Typology Code available for payment source

## 2020-01-05 ENCOUNTER — Other Ambulatory Visit (HOSPITAL_COMMUNITY)
Admission: RE | Admit: 2020-01-05 | Discharge: 2020-01-05 | Disposition: A | Discharge status: Home / Self Care | Payer: No Typology Code available for payment source | Source: Ambulatory Visit | Attending: Plastic Surgery | Admitting: Plastic Surgery

## 2020-01-05 DIAGNOSIS — Z01812 Encounter for preprocedural laboratory examination: Secondary | ICD-10-CM | POA: Diagnosis not present

## 2020-01-05 DIAGNOSIS — Z20822 Contact with and (suspected) exposure to covid-19: Secondary | ICD-10-CM | POA: Diagnosis not present

## 2020-01-05 LAB — SARS CORONAVIRUS 2 (TAT 6-24 HRS): SARS Coronavirus 2: NEGATIVE

## 2020-01-07 ENCOUNTER — Ambulatory Visit (HOSPITAL_BASED_OUTPATIENT_CLINIC_OR_DEPARTMENT_OTHER)
Admission: RE | Admit: 2020-01-07 | Discharge: 2020-01-07 | Disposition: A | Discharge status: Home / Self Care | Payer: No Typology Code available for payment source | Attending: Plastic Surgery | Admitting: Plastic Surgery

## 2020-01-07 ENCOUNTER — Ambulatory Visit (HOSPITAL_BASED_OUTPATIENT_CLINIC_OR_DEPARTMENT_OTHER): Payer: No Typology Code available for payment source | Admitting: Anesthesiology

## 2020-01-07 ENCOUNTER — Other Ambulatory Visit: Payer: Self-pay

## 2020-01-07 ENCOUNTER — Encounter (HOSPITAL_BASED_OUTPATIENT_CLINIC_OR_DEPARTMENT_OTHER): Payer: Self-pay | Admitting: Plastic Surgery

## 2020-01-07 ENCOUNTER — Encounter (HOSPITAL_BASED_OUTPATIENT_CLINIC_OR_DEPARTMENT_OTHER)
Admission: RE | Disposition: A | Discharge status: Home / Self Care | Payer: Self-pay | Source: Home / Self Care | Attending: Plastic Surgery

## 2020-01-07 DIAGNOSIS — Z9884 Bariatric surgery status: Secondary | ICD-10-CM | POA: Insufficient documentation

## 2020-01-07 DIAGNOSIS — M793 Panniculitis, unspecified: Secondary | ICD-10-CM

## 2020-01-07 DIAGNOSIS — Z79899 Other long term (current) drug therapy: Secondary | ICD-10-CM | POA: Diagnosis not present

## 2020-01-07 DIAGNOSIS — Z882 Allergy status to sulfonamides status: Secondary | ICD-10-CM | POA: Diagnosis not present

## 2020-01-07 HISTORY — PX: LIPOSUCTION: SHX10

## 2020-01-07 HISTORY — PX: PANNICULECTOMY: SHX5360

## 2020-01-07 SURGERY — PANNICULECTOMY
Anesthesia: General | Site: Abdomen

## 2020-01-07 MED ORDER — MIDAZOLAM HCL 2 MG/2ML IJ SOLN
INTRAMUSCULAR | Status: AC
  Filled 2020-01-07: qty 2

## 2020-01-07 MED ORDER — ROCURONIUM BROMIDE 10 MG/ML (PF) SYRINGE
PREFILLED_SYRINGE | INTRAVENOUS | Status: AC
  Filled 2020-01-07: qty 10

## 2020-01-07 MED ORDER — EPINEPHRINE PF 1 MG/ML IJ SOLN: INTRAMUSCULAR | Status: DC | PRN

## 2020-01-07 MED ORDER — HYDROMORPHONE HCL 1 MG/ML IJ SOLN
0.2500 mg | INTRAMUSCULAR | Status: DC | PRN
  Administered 2020-01-07 (×2): 0.5 mg via INTRAVENOUS

## 2020-01-07 MED ORDER — SODIUM CHLORIDE 0.9% FLUSH: 3.0000 mL | INTRAVENOUS | Status: DC | PRN

## 2020-01-07 MED ORDER — EPHEDRINE 5 MG/ML INJ
INTRAVENOUS | Status: AC
  Filled 2020-01-07: qty 10

## 2020-01-07 MED ORDER — ACETAMINOPHEN 500 MG PO TABS
ORAL_TABLET | ORAL | Status: AC
  Filled 2020-01-07: qty 2

## 2020-01-07 MED ORDER — ONDANSETRON HCL 4 MG/2ML IJ SOLN
INTRAMUSCULAR | Status: AC
  Filled 2020-01-07: qty 2

## 2020-01-07 MED ORDER — SUFENTANIL CITRATE 50 MCG/ML IV SOLN
INTRAVENOUS | Status: AC
  Filled 2020-01-07: qty 1

## 2020-01-07 MED ORDER — LIDOCAINE HCL 1 % IJ SOLN
INTRAVENOUS | Status: DC | PRN
  Administered 2020-01-07: 1000 mL

## 2020-01-07 MED ORDER — MIDAZOLAM HCL 5 MG/5ML IJ SOLN
INTRAMUSCULAR | Status: DC | PRN
  Administered 2020-01-07: 2 mg via INTRAVENOUS

## 2020-01-07 MED ORDER — LIDOCAINE HCL (PF) 1 % IJ SOLN
INTRAMUSCULAR | Status: AC
  Filled 2020-01-07: qty 60

## 2020-01-07 MED ORDER — SUCCINYLCHOLINE CHLORIDE 200 MG/10ML IV SOSY
PREFILLED_SYRINGE | INTRAVENOUS | Status: AC
  Filled 2020-01-07: qty 10

## 2020-01-07 MED ORDER — CEFAZOLIN SODIUM-DEXTROSE 2-4 GM/100ML-% IV SOLN
2.0000 g | INTRAVENOUS | Status: AC
  Administered 2020-01-07: 2 g via INTRAVENOUS

## 2020-01-07 MED ORDER — LACTATED RINGERS IV SOLN: INTRAVENOUS | Status: DC

## 2020-01-07 MED ORDER — ACETAMINOPHEN 325 MG RE SUPP: 650.0000 mg | RECTAL | Status: DC | PRN

## 2020-01-07 MED ORDER — ACETAMINOPHEN 500 MG PO TABS
1000.0000 mg | ORAL_TABLET | Freq: Once | ORAL | Status: AC
  Administered 2020-01-07: 1000 mg via ORAL

## 2020-01-07 MED ORDER — LIDOCAINE 2% (20 MG/ML) 5 ML SYRINGE
INTRAMUSCULAR | Status: AC
  Filled 2020-01-07: qty 5

## 2020-01-07 MED ORDER — ACETAMINOPHEN 500 MG PO TABS: 1000.0000 mg | ORAL_TABLET | ORAL | Status: DC

## 2020-01-07 MED ORDER — SUFENTANIL CITRATE 50 MCG/ML IV SOLN
INTRAVENOUS | Status: DC | PRN
Start: 2020-01-07 — End: 2020-01-07
  Administered 2020-01-07 (×2): 10 ug via INTRAVENOUS

## 2020-01-07 MED ORDER — BUPIVACAINE LIPOSOME 1.3 % IJ SUSP: 20.0000 mL | Freq: Once | INTRAMUSCULAR | Status: DC

## 2020-01-07 MED ORDER — CHLORHEXIDINE GLUCONATE 4 % EX LIQD: 60.0000 mL | Freq: Once | CUTANEOUS | Status: DC

## 2020-01-07 MED ORDER — SODIUM CHLORIDE 0.9% FLUSH: 3.0000 mL | Freq: Two times a day (BID) | INTRAVENOUS | Status: DC

## 2020-01-07 MED ORDER — PROPOFOL 10 MG/ML IV BOLUS
INTRAVENOUS | Status: DC | PRN
  Administered 2020-01-07: 100 mg via INTRAVENOUS

## 2020-01-07 MED ORDER — FENTANYL CITRATE (PF) 100 MCG/2ML IJ SOLN
INTRAMUSCULAR | Status: AC
  Filled 2020-01-07: qty 2

## 2020-01-07 MED ORDER — HYDROMORPHONE HCL 1 MG/ML IJ SOLN
INTRAMUSCULAR | Status: AC
  Filled 2020-01-07: qty 0.5

## 2020-01-07 MED ORDER — LACTATED RINGERS IV SOLN: INTRAVENOUS | Status: DC | PRN

## 2020-01-07 MED ORDER — CHLORHEXIDINE GLUCONATE CLOTH 2 % EX PADS: 6.0000 | MEDICATED_PAD | Freq: Once | CUTANEOUS | Status: DC

## 2020-01-07 MED ORDER — CEFAZOLIN SODIUM-DEXTROSE 2-4 GM/100ML-% IV SOLN: 2.0000 g | INTRAVENOUS | Status: DC

## 2020-01-07 MED ORDER — LIDOCAINE-EPINEPHRINE 1 %-1:100000 IJ SOLN
INTRAMUSCULAR | Status: DC | PRN
  Administered 2020-01-07: 20 mL

## 2020-01-07 MED ORDER — ROCURONIUM BROMIDE 100 MG/10ML IV SOLN
INTRAVENOUS | Status: DC | PRN
  Administered 2020-01-07: 50 mg via INTRAVENOUS

## 2020-01-07 MED ORDER — OXYCODONE HCL 5 MG PO TABS: 5.0000 mg | ORAL_TABLET | ORAL | Status: DC | PRN

## 2020-01-07 MED ORDER — LIDOCAINE-EPINEPHRINE 1 %-1:100000 IJ SOLN
INTRAMUSCULAR | Status: AC
  Filled 2020-01-07: qty 1

## 2020-01-07 MED ORDER — CELECOXIB 200 MG PO CAPS
200.0000 mg | ORAL_CAPSULE | Freq: Once | ORAL | Status: AC
  Administered 2020-01-07: 200 mg via ORAL

## 2020-01-07 MED ORDER — DIPHENHYDRAMINE HCL 50 MG/ML IJ SOLN
INTRAMUSCULAR | Status: AC
  Filled 2020-01-07: qty 1

## 2020-01-07 MED ORDER — CEFAZOLIN SODIUM-DEXTROSE 2-4 GM/100ML-% IV SOLN
INTRAVENOUS | Status: AC
  Filled 2020-01-07: qty 100

## 2020-01-07 MED ORDER — DEXAMETHASONE SODIUM PHOSPHATE 10 MG/ML IJ SOLN
INTRAMUSCULAR | Status: AC
  Filled 2020-01-07: qty 1

## 2020-01-07 MED ORDER — SUGAMMADEX SODIUM 200 MG/2ML IV SOLN
INTRAVENOUS | Status: DC | PRN
  Administered 2020-01-07: 200 mg via INTRAVENOUS

## 2020-01-07 MED ORDER — FENTANYL CITRATE (PF) 100 MCG/2ML IJ SOLN: 25.0000 ug | INTRAMUSCULAR | Status: DC | PRN

## 2020-01-07 MED ORDER — GABAPENTIN 300 MG PO CAPS: 300.0000 mg | ORAL_CAPSULE | ORAL | Status: DC

## 2020-01-07 MED ORDER — EPINEPHRINE PF 1 MG/ML IJ SOLN
INTRAMUSCULAR | Status: AC
  Filled 2020-01-07: qty 1

## 2020-01-07 MED ORDER — PHENYLEPHRINE 40 MCG/ML (10ML) SYRINGE FOR IV PUSH (FOR BLOOD PRESSURE SUPPORT)
PREFILLED_SYRINGE | INTRAVENOUS | Status: AC
  Filled 2020-01-07: qty 10

## 2020-01-07 MED ORDER — PROPOFOL 500 MG/50ML IV EMUL
INTRAVENOUS | Status: AC
  Filled 2020-01-07: qty 50

## 2020-01-07 MED ORDER — ACETAMINOPHEN 325 MG PO TABS: 650.0000 mg | ORAL_TABLET | ORAL | Status: DC | PRN

## 2020-01-07 MED ORDER — DEXAMETHASONE SODIUM PHOSPHATE 4 MG/ML IJ SOLN
INTRAMUSCULAR | Status: DC | PRN
  Administered 2020-01-07: 10 mg via INTRAVENOUS

## 2020-01-07 MED ORDER — SODIUM CHLORIDE 0.9 % IV SOLN: 250.0000 mL | INTRAVENOUS | Status: DC | PRN

## 2020-01-07 MED ORDER — DIPHENHYDRAMINE HCL 50 MG/ML IJ SOLN
INTRAMUSCULAR | Status: DC | PRN
  Administered 2020-01-07: 6.25 mg via INTRAVENOUS

## 2020-01-07 MED ORDER — CELECOXIB 200 MG PO CAPS
ORAL_CAPSULE | ORAL | Status: AC
  Filled 2020-01-07: qty 1

## 2020-01-07 SURGICAL SUPPLY — 83 items
APPLIER CLIP 9.375 MED OPEN (MISCELLANEOUS)
BAG DECANTER FOR FLEXI CONT (MISCELLANEOUS) ×3 IMPLANT
BINDER ABDOMINAL  9 SM 30-45 (SOFTGOODS)
BINDER ABDOMINAL 10 UNV 27-48 (MISCELLANEOUS) IMPLANT
BINDER ABDOMINAL 12 SM 30-45 (SOFTGOODS) ×3 IMPLANT
BINDER ABDOMINAL 9 SM 30-45 (SOFTGOODS) IMPLANT
BINDER BREAST LRG (GAUZE/BANDAGES/DRESSINGS) IMPLANT
BINDER BREAST MEDIUM (GAUZE/BANDAGES/DRESSINGS) IMPLANT
BINDER BREAST XLRG (GAUZE/BANDAGES/DRESSINGS) IMPLANT
BINDER BREAST XXLRG (GAUZE/BANDAGES/DRESSINGS) IMPLANT
BIOPATCH RED 1 DISK 7.0 (GAUZE/BANDAGES/DRESSINGS) ×2 IMPLANT
BIOPATCH RED 1IN DISK 7.0MM (GAUZE/BANDAGES/DRESSINGS) ×1
BLADE CLIPPER SURG (BLADE) ×3 IMPLANT
BLADE HEX COATED 2.75 (ELECTRODE) IMPLANT
BLADE SURG 10 STRL SS (BLADE) ×3 IMPLANT
BLADE SURG 15 STRL LF DISP TIS (BLADE) ×2 IMPLANT
BLADE SURG 15 STRL SS (BLADE) ×4
BNDG GAUZE ELAST 4 BULKY (GAUZE/BANDAGES/DRESSINGS) ×6 IMPLANT
CANISTER SUCT 1200ML W/VALVE (MISCELLANEOUS) ×3 IMPLANT
CLIP APPLIE 9.375 MED OPEN (MISCELLANEOUS) IMPLANT
COVER BACK TABLE 60X90IN (DRAPES) ×3 IMPLANT
COVER MAYO STAND STRL (DRAPES) ×3 IMPLANT
COVER WAND RF STERILE (DRAPES) IMPLANT
DECANTER SPIKE VIAL GLASS SM (MISCELLANEOUS) IMPLANT
DERMABOND ADVANCED (GAUZE/BANDAGES/DRESSINGS) ×6
DERMABOND ADVANCED .7 DNX12 (GAUZE/BANDAGES/DRESSINGS) ×3 IMPLANT
DRAIN CHANNEL 15F RND FF W/TCR (WOUND CARE) ×3 IMPLANT
DRAIN CHANNEL 19F RND (DRAIN) IMPLANT
DRAPE LAPAROSCOPIC ABDOMINAL (DRAPES) ×3 IMPLANT
DRSG OPSITE POSTOP 4X12 (GAUZE/BANDAGES/DRESSINGS) ×3 IMPLANT
DRSG PAD ABDOMINAL 8X10 ST (GAUZE/BANDAGES/DRESSINGS) ×12 IMPLANT
ELECT BLADE 4.0 EZ CLEAN MEGAD (MISCELLANEOUS) ×3
ELECT REM PT RETURN 9FT ADLT (ELECTROSURGICAL) ×3
ELECTRODE BLDE 4.0 EZ CLN MEGD (MISCELLANEOUS) ×1 IMPLANT
ELECTRODE REM PT RTRN 9FT ADLT (ELECTROSURGICAL) ×1 IMPLANT
EVACUATOR SILICONE 100CC (DRAIN) ×3 IMPLANT
GAUZE SPONGE 4X4 12PLY STRL (GAUZE/BANDAGES/DRESSINGS) IMPLANT
GLOVE BIO SURGEON STRL SZ 6.5 (GLOVE) ×8 IMPLANT
GLOVE BIO SURGEON STRL SZ7 (GLOVE) IMPLANT
GLOVE BIO SURGEONS STRL SZ 6.5 (GLOVE) ×4
GLOVE BIOGEL M STRL SZ7.5 (GLOVE) ×3 IMPLANT
GLOVE BIOGEL PI IND STRL 7.0 (GLOVE) ×1 IMPLANT
GLOVE BIOGEL PI INDICATOR 7.0 (GLOVE) ×2
GOWN STRL REUS W/ TWL LRG LVL3 (GOWN DISPOSABLE) ×2 IMPLANT
GOWN STRL REUS W/TWL LRG LVL3 (GOWN DISPOSABLE) ×4
LINER CANISTER 1000CC FLEX (MISCELLANEOUS) ×3 IMPLANT
NDL SAFETY ECLIPSE 18X1.5 (NEEDLE) ×1 IMPLANT
NEEDLE HYPO 18GX1.5 SHARP (NEEDLE) ×2
NEEDLE HYPO 25X1 1.5 SAFETY (NEEDLE) ×3 IMPLANT
NS IRRIG 1000ML POUR BTL (IV SOLUTION) ×3 IMPLANT
PACK BASIN DAY SURGERY FS (CUSTOM PROCEDURE TRAY) ×3 IMPLANT
PACK UNIVERSAL I (CUSTOM PROCEDURE TRAY) ×3 IMPLANT
PAD ALCOHOL SWAB (MISCELLANEOUS) ×3 IMPLANT
PENCIL SMOKE EVACUATOR (MISCELLANEOUS) ×3 IMPLANT
PIN SAFETY STERILE (MISCELLANEOUS) ×3 IMPLANT
SLEEVE SCD COMPRESS KNEE MED (MISCELLANEOUS) ×3 IMPLANT
SPONGE LAP 18X18 RF (DISPOSABLE) ×9 IMPLANT
STRIP SUTURE WOUND CLOSURE 1/2 (MISCELLANEOUS) IMPLANT
SUT MNCRL AB 4-0 PS2 18 (SUTURE) ×6 IMPLANT
SUT MON AB 3-0 SH 27 (SUTURE) ×12
SUT MON AB 3-0 SH27 (SUTURE) ×6 IMPLANT
SUT MON AB 5-0 PS2 18 (SUTURE) ×6 IMPLANT
SUT PDS 3-0 CT2 (SUTURE) ×9
SUT PDS II 3-0 CT2 27 ABS (SUTURE) ×3 IMPLANT
SUT SILK 2 0 SH (SUTURE) ×3 IMPLANT
SUT VIC AB 3-0 SH 27 (SUTURE)
SUT VIC AB 3-0 SH 27X BRD (SUTURE) IMPLANT
SUT VICRYL 4-0 PS2 18IN ABS (SUTURE) IMPLANT
SYR 50ML LL SCALE MARK (SYRINGE) ×3 IMPLANT
SYR BULB IRRIG 60ML STRL (SYRINGE) ×3 IMPLANT
SYR CONTROL 10ML LL (SYRINGE) ×3 IMPLANT
SYR TB 1ML LL NO SAFETY (SYRINGE) IMPLANT
TOWEL GREEN STERILE FF (TOWEL DISPOSABLE) ×6 IMPLANT
TRAY DSU PREP LF (CUSTOM PROCEDURE TRAY) ×3 IMPLANT
TRAY FOL W/BAG SLVR 16FR STRL (SET/KITS/TRAYS/PACK) IMPLANT
TRAY FOLEY W/BAG SLVR 14FR LF (SET/KITS/TRAYS/PACK) IMPLANT
TRAY FOLEY W/BAG SLVR 16FR LF (SET/KITS/TRAYS/PACK)
TUBE CONNECTING 20'X1/4 (TUBING) ×1
TUBE CONNECTING 20X1/4 (TUBING) ×2 IMPLANT
TUBING INFILTRATION IT-10001 (TUBING) IMPLANT
TUBING SET GRADUATE ASPIR 12FT (MISCELLANEOUS) ×3 IMPLANT
UNDERPAD 30X36 HEAVY ABSORB (UNDERPADS AND DIAPERS) ×6 IMPLANT
YANKAUER SUCT BULB TIP NO VENT (SUCTIONS) ×3 IMPLANT

## 2020-01-07 NOTE — Op Note (Signed)
Operative Report  Date of operation: 01/07/2020  Patient: Heidi Jordan, MRN: [id number], 54 y.o. female.   Date of birth: 08-14-1965  Location: [facility]  Preoperative Diagnosis:  Abdominal panniculitis  Postoperative Diagnosis:  Same  Procedure:  Panniculectomy 3713 gm  Surgeon:  Wayland Denis Pilot Prindle  Assistant:  Keenan Bachelor, PA  Anesthesia:  General  EBL:  150cc  Drains: number 19 blake round drain  Condition:  Stable  Complications: None  Disposition: Recovery Room  Procedure in Detail: Patient was seen the morning of her surgery.  The patient was marked and the surgery plan was reviewed.  An IV was placed and antibiotics were administered.  The patient was taken to the operating room and underwent general anesthesia.  A time out was called and all information was confirmed to be correct. SCD's were placed on the legs.  A pillow was placed under the knees. The patient was prepped and draped in the standard sterile fashion. Local was placed into the incision and 2 stab incisions made laterally.  Tumescent was placed in each flank area. Liposuction was performed for improved contour. The marked lower incision was incised with dissection taken to scarpa's fascia and the rectus abdominus fascia. The skin and subcutaneous tissue was then lifted off the fascia to the level of the umbilicus.  The patient was flexed on the table with added in determining the amount of abdomen skin that would be safely excised.  The pannus was excised and weighed. The mons was also suspended with 3-0 PDS.  The tissue was irrigated with normal saline solution. A #19 blake round drain was placed and secured to the skin with 3-0 Silk.  The patient was flexed and closure begun.  The abdominal wall was closed with buried 3-0 PDS and Monocryl.  The subcuticular 4-0 Monocryl was then placed.  Several 5-0 Monocryl vertical mattress sutures were placed.    Dermabond was applied.  ABD's and an abdominal binder were placed. Patient was allowed to wake up, extubated and taken on a stretcher in the flexed position to the recovery room. Family was notified at the end of the case.   The advanced practice practitioner (APP) assisted throughout the case.  The APP was essential in retraction and counter traction when needed to make the case progress smoothly.  This retraction and assistance made it possible to see the tissue plans for the procedure.  The assistance was needed for blood control, tissue re-approximation and assisted with closure of the incision site.

## 2020-01-07 NOTE — Interval H&P Note (Signed)
History and Physical Interval Note:  01/07/2020 8:00 AM  Heidi Jordan  has presented today for surgery, with the diagnosis of panniculitis.  The various methods of treatment have been discussed with the patient and family. After consideration of risks, benefits and other options for treatment, the patient has consented to  Procedure(s) with comments: PANNICULECTOMY (N/A) - 3 hours, please LIPOSUCTION (N/A) as a surgical intervention.  The patient's history has been reviewed, patient examined, no change in status, stable for surgery.  I have reviewed the patient's chart and labs.  Questions were answered to the patient's satisfaction.     Alena Bills Leldon Steege

## 2020-01-07 NOTE — Anesthesia Preprocedure Evaluation (Addendum)
Anesthesia Evaluation  Patient identified by MRN, date of birth, ID band  Reviewed: Allergy & Precautions, H&P , NPO status , Patient's Chart, lab work & pertinent test results  Airway Mallampati: II  TM Distance: >3 FB Neck ROM: Full    Dental no notable dental hx. (+) Teeth Intact, Dental Advisory Given   Pulmonary neg pulmonary ROS,    Pulmonary exam normal breath sounds clear to auscultation       Cardiovascular negative cardio ROS   Rhythm:Regular Rate:Normal     Neuro/Psych  Headaches, negative psych ROS   GI/Hepatic Neg liver ROS, hiatal hernia, GERD  Medicated,  Endo/Other  negative endocrine ROS  Renal/GU negative Renal ROS  negative genitourinary   Musculoskeletal   Abdominal   Peds  Hematology  (+) Blood dyscrasia, anemia ,   Anesthesia Other Findings   Reproductive/Obstetrics negative OB ROS                            Anesthesia Physical Anesthesia Plan  ASA: II  Anesthesia Plan: General   Post-op Pain Management:    Induction: Intravenous  PONV Risk Score and Plan: 4 or greater and Ondansetron, Dexamethasone and Midazolam  Airway Management Planned: Oral ETT  Additional Equipment:   Intra-op Plan:   Post-operative Plan: Extubation in OR  Informed Consent: I have reviewed the patients History and Physical, chart, labs and discussed the procedure including the risks, benefits and alternatives for the proposed anesthesia with the patient or authorized representative who has indicated his/her understanding and acceptance.     Dental advisory given  Plan Discussed with: CRNA  Anesthesia Plan Comments:         Anesthesia Quick Evaluation

## 2020-01-07 NOTE — Discharge Instructions (Addendum)
JP Drain [employer] this sheet to all of your post-operative appointments while you have your drains.  Please measure your drains by CC's or ML's.  Make sure you drain and measure your JP Drains 2 or 3 times per day.  At the end of each day, add up totals for the left side and add up totals for the right side.    ( 9 am )     ( 3 pm )        ( 9 pm )                Date L  R  L  R  L  R  Total L/R                                                                                                                                                                                           Next dose of Tylenol can be given after 1:30PM.   Post Anesthesia Home Care Instructions  Activity: Get plenty of rest for the remainder of the day. A responsible individual must stay with you for 24 hours following the procedure.  For the next 24 hours, DO NOT: -Drive a car -Advertising copywriter -Drink alcoholic beverages -Take any medication unless instructed by your physician -Make any legal decisions or sign important papers.  Meals: Start with liquid foods such as gelatin or soup. Progress to regular foods as tolerated. Avoid greasy, spicy, heavy foods. If nausea and/or vomiting occur, drink only clear liquids until the nausea and/or vomiting subsides. Call your physician if vomiting continues.  Special Instructions/Symptoms: Your throat may feel dry or sore from the anesthesia or the breathing tube placed in your throat during surgery. If this causes discomfort, gargle with warm salt water. The discomfort should disappear within 24 hours.  If you had a scopolamine patch placed behind your ear for the management of post- operative nausea and/or vomiting:  1. The medication in the patch is effective for 72 hours, after which it should be removed.  Wrap patch in a tissue and discard in the trash. Wash hands thoroughly with soap and water. 2. You may remove the patch earlier than  72 hours if you experience unpleasant side effects which may include dry mouth, dizziness or visual disturbances. 3. Avoid touching the patch. Wash your hands with soap and water after contact with the patch.         INSTRUCTIONS FOR AFTER SURGERY   You will likely have some questions about what to expect following your operation.  The following information will help you and your  family understand what to expect when you are discharged from the hospital.  Following these guidelines will help ensure a smooth recovery and reduce risks of complications.  Postoperative instructions include information on: diet, wound care, medications and physical activity.  AFTER SURGERY Expect to go home after the procedure.  In some cases, you may need to spend one night in the hospital for observation.  DIET This surgery does not require a specific diet.  However, I have to mention that the healthier you eat the better your body can start healing. It is important to increasing your protein intake.  This means limiting the foods with added sugar.  Focus on fruits and vegetables and some meat. It is very important to drink water after your surgery.  If your urine is bright yellow, then it is concentrated, and you need to drink more water.  As a general rule after surgery, you should have 8 ounces of water every hour while awake.  If you find you are persistently nauseated or unable to take in liquids let us know.  NO TOBACCO USE or EXPOSURE.  This will slow your healing process and increase the risk of a wound.  WOUND CARE If you have a drain: Clean with baby wipes until the drain is removed.   If you have steri-strips / tape directly attached to your skin leave them in place. It is OK to get these wet.  No baths, pools or hot tubs for two weeks. We close your incision to leave the smallest and best-looking scar. No ointment or creams on your incisions until given the go ahead.  Especially not Neosporin (Too many  skin reactions with this one).  A few weeks after surgery you can use Mederma and start massaging the scar. We ask you to wear your binder or sports bra for the first 6 weeks around the clock, including while sleeping. This provides added comfort and helps reduce the fluid accumulation at the surgery site.  ACTIVITY No heavy lifting until cleared by the doctor.  It is OK to walk and climb stairs. In fact, moving your legs is very important to decrease your risk of a blood clot.  It will also help keep you from getting deconditioned.  Every 1 to 2 hours get up and walk for 5 minutes. This will help with a quicker recovery back to normal.  Let pain be your guide so you don't do too much.  NO, you cannot do the spring cleaning and don't plan on taking care of anyone else.  This is your time for TLC.   WORK Everyone returns to work at different times. As a rough guide, most people take at least 1 - 2 weeks off prior to returning to work. If you need documentation for your job, bring the forms to your postoperative follow up visit.  DRIVING Arrange for someone to bring you home from the hospital.  You may be able to drive a few days after surgery but not while taking any narcotics or valium.  BOWEL MOVEMENTS Constipation can occur after anesthesia and while taking pain medication.  It is important to stay ahead for your comfort.  We recommend taking Milk of Magnesia (2 tablespoons; twice a day) while taking the pain pills.  SEROMA This is fluid your body tried to put in the surgical site.  This is normal but if it creates excessive pain and swelling let us know.  It usually decreases in a few weeks.  MEDICATIONS and  PAIN CONTROL At your preoperative visit for you history and physical you were given the following medications: 1. An antibiotic: Start this medication when you get home and take according to the instructions on the bottle. 2. Zofran 4 mg:  This is to treat nausea and vomiting.  You can  take this every 6 hours as needed and only if needed. 3. Norco (hydrocodone/acetaminophen) 5/325 mg:  This is only to be used after you have taken the motrin or the tylenol. Every 8 hours as needed. Over the counter Medication to take: 4. Ibuprofen (Motrin) 600 mg:  Take this every 6 hours.  If you have additional pain then take 500 mg of the tylenol.  Only take the Norco after you have tried these two. 5. Miralax or stool softener of choice: Take this according to the bottle if you take the Norco.  WHEN TO CALL Call your surgeon's office if any of the following occur: . Fever 101 degrees F or greater . Excessive bleeding or fluid from the incision site. . Pain that increases over time without aid from the medications . Redness, warmth, or pus draining from incision sites . Persistent nausea or inability to take in liquids . Severe misshapen area that underwent the operation.

## 2020-01-07 NOTE — Anesthesia Procedure Notes (Signed)
Procedure Name: Intubation Date/Time: 01/07/2020 8:29 AM Performed by: Ronnette Hila, CRNA Pre-anesthesia Checklist: Patient identified, Emergency Drugs available, Suction available and Patient being monitored Patient Re-evaluated:Patient Re-evaluated prior to induction Oxygen Delivery Method: Circle system utilized Preoxygenation: Pre-oxygenation with 100% oxygen Induction Type: IV induction Ventilation: Mask ventilation without difficulty Tube type: Oral Tube size: 7.0 mm Number of attempts: 1 Airway Equipment and Method: Stylet and Oral airway Placement Confirmation: ETT inserted through vocal cords under direct vision,  positive ETCO2 and breath sounds checked- equal and bilateral Secured at: 22 cm Tube secured with: Tape Dental Injury: Teeth and Oropharynx as per pre-operative assessment

## 2020-01-07 NOTE — Transfer of Care (Signed)
Immediate Anesthesia Transfer of Care Note  Patient: Heidi Jordan  Procedure(s) Performed: PANNICULECTOMY (N/A Abdomen) LIPOSUCTION (N/A Abdomen)  Patient Location: PACU  Anesthesia Type:General  Level of Consciousness: awake, alert , oriented and patient cooperative  Airway & Oxygen Therapy: Patient Spontanous Breathing and Patient connected to face mask oxygen  Post-op Assessment: Report given to RN and Post -op Vital signs reviewed and stable  Post vital signs: Reviewed and stable  Last Vitals:  Vitals Value Taken Time  BP 146/118 01/07/20 1100  Temp    Pulse 102 01/07/20 1102  Resp 18 01/07/20 1102  SpO2 100 % 01/07/20 1102  Vitals shown include unvalidated device data.  Last Pain:  Vitals:   01/07/20 0729  TempSrc: Oral  PainSc: 0-No pain         Complications: No complications documented.

## 2020-01-07 NOTE — Anesthesia Postprocedure Evaluation (Signed)
Anesthesia Post Note  Patient: Heidi Jordan  Procedure(s) Performed: PANNICULECTOMY (N/A Abdomen) LIPOSUCTION (N/A Abdomen)     Patient location during evaluation: PACU Anesthesia Type: General Level of consciousness: awake and alert Pain management: pain level controlled Vital Signs Assessment: post-procedure vital signs reviewed and stable Respiratory status: spontaneous breathing, nonlabored ventilation and respiratory function stable Cardiovascular status: blood pressure returned to baseline and stable Postop Assessment: no apparent nausea or vomiting Anesthetic complications: no   No complications documented.  Last Vitals:  Vitals:   01/07/20 1125 01/07/20 1130  BP:  (!) 140/91  Pulse: 90 92  Resp: 18 14  Temp:    SpO2: 98% 96%    Last Pain:  Vitals:   01/07/20 1130  TempSrc:   PainSc: 4                  Teola Felipe,W. EDMOND

## 2020-01-13 ENCOUNTER — Ambulatory Visit (INDEPENDENT_AMBULATORY_CARE_PROVIDER_SITE_OTHER): Payer: No Typology Code available for payment source | Admitting: Plastic Surgery

## 2020-01-13 ENCOUNTER — Encounter: Payer: Self-pay | Admitting: Plastic Surgery

## 2020-01-13 ENCOUNTER — Other Ambulatory Visit: Payer: Self-pay

## 2020-01-13 ENCOUNTER — Other Ambulatory Visit: Payer: Self-pay | Admitting: Plastic Surgery

## 2020-01-13 VITALS — BP 141/89 | HR 85 | Temp 98.4°F

## 2020-01-13 DIAGNOSIS — M793 Panniculitis, unspecified: Secondary | ICD-10-CM

## 2020-01-13 MED ORDER — HYDROCODONE-ACETAMINOPHEN 5-325 MG PO TABS: 1.0000 | ORAL_TABLET | Freq: Two times a day (BID) | ORAL | 0 refills | Status: DC | PRN

## 2020-01-13 MED FILL — HYDROCODON-APAP 5-325: 5-325 | 7 days supply | Qty: 14 | Fill #0

## 2020-01-13 NOTE — Progress Notes (Signed)
The patient is a 54 year old female here for follow-up on her panniculectomy.  Her drain is in.  The drainage output has been minimal.  She has quite a bit of swelling and bruising as expected.  No sign of infection.  All incision sites appear to be intact and healing.  We will keep the drain in for another week.  We will plan to remove the dressing in the next week or 2.  Patient states her bowels are moving and her pain is controlled with the hydrocodone for which she has asked for 1 more refill.

## 2020-01-20 ENCOUNTER — Other Ambulatory Visit (INDEPENDENT_AMBULATORY_CARE_PROVIDER_SITE_OTHER): Payer: Self-pay | Admitting: Specialist

## 2020-01-20 ENCOUNTER — Ambulatory Visit (INDEPENDENT_AMBULATORY_CARE_PROVIDER_SITE_OTHER): Payer: No Typology Code available for payment source | Admitting: Plastic Surgery

## 2020-01-20 ENCOUNTER — Other Ambulatory Visit: Payer: Self-pay

## 2020-01-20 ENCOUNTER — Encounter: Payer: Self-pay | Admitting: Plastic Surgery

## 2020-01-20 VITALS — BP 127/86 | HR 90 | Temp 98.3°F

## 2020-01-20 DIAGNOSIS — M793 Panniculitis, unspecified: Secondary | ICD-10-CM

## 2020-01-20 NOTE — Progress Notes (Signed)
The patient is a 54 year old female here for follow-up on her panniculectomy.  She is doing well.  The drain output has been minimal but higher than I would like for removal.  The incisions are intact and the dressings are in place.  We discussed the option for removal or keeping him for 1 more week.  The patient agrees to keep the drain till early next week.  I think this is wise.

## 2020-01-26 ENCOUNTER — Other Ambulatory Visit: Payer: Self-pay

## 2020-01-26 ENCOUNTER — Encounter: Payer: Self-pay | Admitting: Surgical

## 2020-01-26 ENCOUNTER — Ambulatory Visit (INDEPENDENT_AMBULATORY_CARE_PROVIDER_SITE_OTHER): Payer: No Typology Code available for payment source | Admitting: Surgical

## 2020-01-26 VITALS — BP 135/79 | HR 88 | Temp 98.2°F

## 2020-01-26 DIAGNOSIS — M546 Pain in thoracic spine: Secondary | ICD-10-CM

## 2020-01-26 DIAGNOSIS — G8929 Other chronic pain: Secondary | ICD-10-CM

## 2020-01-26 DIAGNOSIS — M793 Panniculitis, unspecified: Secondary | ICD-10-CM

## 2020-01-26 DIAGNOSIS — N62 Hypertrophy of breast: Secondary | ICD-10-CM

## 2020-01-26 NOTE — Progress Notes (Signed)
Patient is a 54 year old female here for follow-up after panniculectomy on 01/07/2020 with Dr. Ulice Bold.  She still has her drains in place.  She is here today for reevaluation and possible removal of drain.  Patient reports output has been 40 and 45 cc over the past 2 days.  Prior to that she was outputting approximately 70 cc per 24 hours.  She reports that she is doing well.  She does report that she has been more active, walking the dog and being active during the holiday.  She does not have any infectious symptoms.  She reports normal bowel movements.  She is very eager to have the drain removed as it is causing her discomfort.  Chaperone present on exam On exam panniculectomy incision intact, Steri-Strips in place over panniculectomy incision.  No dehiscence noted.  No erythema noted.  Abdomen is nontender to palpation.  There is no bruising or cellulitic changes noted.  No foul odor is noted.  Right JP drain in place with approximately 10 cc of serosanguineous drainage in the bulb.  No foul odor is noted.  We discussed options for removal of the drain versus leaving it in place for an additional few days to allow drainage to slow down.  Patient has been more active recently, which may be contributing to the increased amount of fluid.  She does not have any fluid collections with palpation on exam.  There is no significant swelling noted.  Patient would like to have drain removed.  We removed the drain, patient tolerated fine.  Recommend applying Vaseline, gauze, tape to JP drain wound site once daily for the next 2 weeks.  Continue to avoid strenuous activities or heavy lifting.  There is no sign of infection, seroma, hematoma.  Recommend following up in 2 weeks for reevaluation.  Call with any questions or concerns.

## 2020-01-30 ENCOUNTER — Encounter: Payer: No Typology Code available for payment source | Admitting: Surgical

## 2020-02-05 ENCOUNTER — Other Ambulatory Visit (HOSPITAL_COMMUNITY): Payer: Self-pay | Admitting: Family Medicine

## 2020-02-05 MED FILL — traMADol HCL 50 MG TABS: 50 | 5 days supply | Qty: 30 | Fill #0

## 2020-02-11 ENCOUNTER — Encounter: Payer: Self-pay | Admitting: Surgical

## 2020-02-11 ENCOUNTER — Other Ambulatory Visit: Payer: Self-pay

## 2020-02-11 ENCOUNTER — Ambulatory Visit (INDEPENDENT_AMBULATORY_CARE_PROVIDER_SITE_OTHER): Payer: No Typology Code available for payment source | Admitting: Surgical

## 2020-02-11 DIAGNOSIS — M793 Panniculitis, unspecified: Secondary | ICD-10-CM

## 2020-02-11 DIAGNOSIS — Z9889 Other specified postprocedural states: Secondary | ICD-10-CM

## 2020-02-11 NOTE — Progress Notes (Signed)
Patient is a 55 year old female here for follow-up after panniculectomy with Dr. Ulice Bold on 01/07/2020.  She is 5 weeks postop.  She reports that she is doing well.  She reports in regards to her bilateral breast reduction she has noticed some bunching of excess skin and a pulling sensation when lifting her bilateral arms.   Chaperone present on exam On exam bilateral breast incisions intact, bilateral NAC's are viable.  She does have what appears to be some scarring along the medial aspect of the vertical limb causing an abnormal contour of the inferior pole of bilateral breasts with lifting of her arms.  In a resting position she has good symmetry and shape/contour.  I am able to palpate some thickened scarring beneath the subcutaneous tissue.  In regards to her panniculectomy, her incision is well-healed.  No wounds are noted. She does have some scarring along the right lateral border that is causing a contour abnormality.  No erythema noted.  No fluid collections noted with palpation.  No cellulitic changes.  No tenderness palpation noted.  Recommend massaging bilateral inframammary fold scars and right lateral panniculectomy scarring multiple times per day to assist with breaking up any scar tissue.  This should decrease any contour abnormalities.  She is aware that surgical intervention may be necessary if she does not see any improvement in 3 to 6 months.  There is no sign of infection, seroma, hematoma of either breast or abdomen.  She is overall very pleased with her outcome.  She has noticed an improvement in her neck and back pain.  We discussed scheduling a follow-up in 6 months or so to reevaluate, patient reports that she will work on massaging and call to schedule a follow-up in approximately 6 months.  Pictures were obtained of the patient and placed in the chart with the patient's or guardian's permission.

## 2020-03-29 MED FILL — AMITRIPTYLINE HCL 50 MG TAB: 50 | 90 days supply | Qty: 90 | Fill #2

## 2020-04-06 ENCOUNTER — Other Ambulatory Visit (HOSPITAL_COMMUNITY): Payer: Self-pay | Admitting: Family Medicine

## 2020-04-06 MED FILL — traMADol HCL 50 MG TABS: 50 | 5 days supply | Qty: 30 | Fill #0

## 2020-04-09 MED FILL — OMEPRAZOLE 20 MG CAP: 20 | 90 days supply | Qty: 180 | Fill #2

## 2020-04-19 ENCOUNTER — Other Ambulatory Visit (HOSPITAL_BASED_OUTPATIENT_CLINIC_OR_DEPARTMENT_OTHER): Payer: Self-pay

## 2020-06-25 ENCOUNTER — Other Ambulatory Visit (HOSPITAL_COMMUNITY): Payer: Self-pay

## 2020-06-25 MED FILL — Amitriptyline HCl Tab 50 MG: ORAL | 90 days supply | Qty: 90 | Fill #0 | Status: AC

## 2020-06-25 MED FILL — Omeprazole Cap Delayed Release 20 MG: ORAL | 90 days supply | Qty: 180 | Fill #0 | Status: AC

## 2020-06-29 ENCOUNTER — Other Ambulatory Visit (HOSPITAL_COMMUNITY): Payer: Self-pay

## 2020-08-06 ENCOUNTER — Other Ambulatory Visit (HOSPITAL_COMMUNITY): Payer: Self-pay

## 2020-08-06 MED ORDER — TRAMADOL HCL 50 MG PO TABS
50.0000 mg | ORAL_TABLET | ORAL | 0 refills | Status: AC | PRN
  Filled 2020-08-06: qty 30, 5d supply, fill #0

## 2020-08-07 ENCOUNTER — Other Ambulatory Visit (HOSPITAL_COMMUNITY): Payer: Self-pay

## 2020-08-11 ENCOUNTER — Other Ambulatory Visit (HOSPITAL_COMMUNITY): Payer: Self-pay

## 2020-08-16 IMAGING — MG DIGITAL SCREENING BILAT W/ CAD
4 series · 4 of 4 positions shown · non-contrast
Comparison: Previous exam(s).

CLINICAL DATA: Screening.

EXAM:
DIGITAL SCREENING BILATERAL MAMMOGRAM WITH CAD

[L CC]
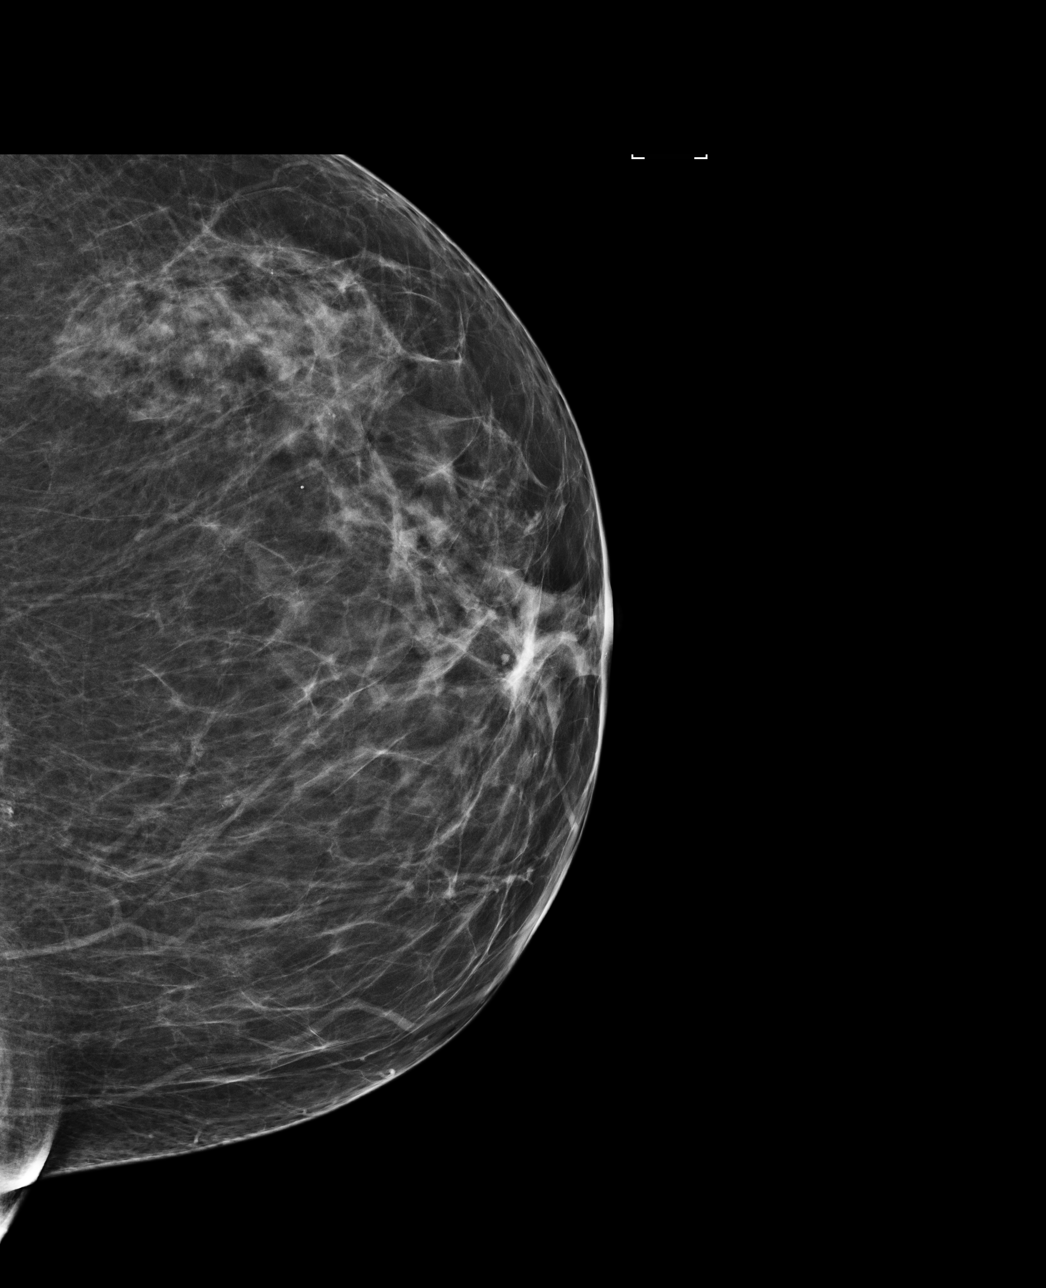

[R MLO]
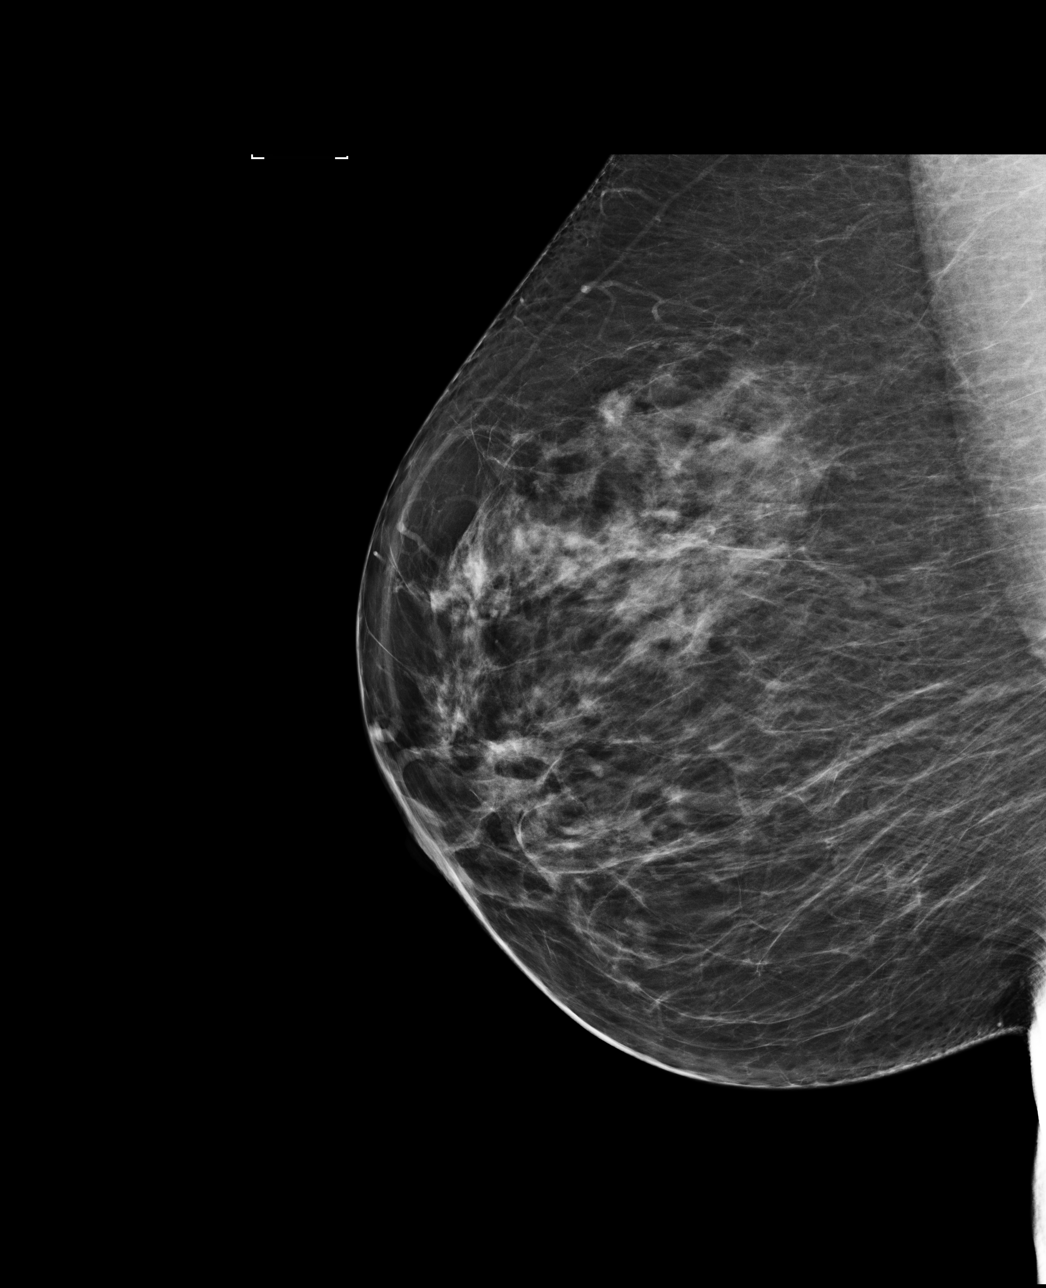

[R CC]
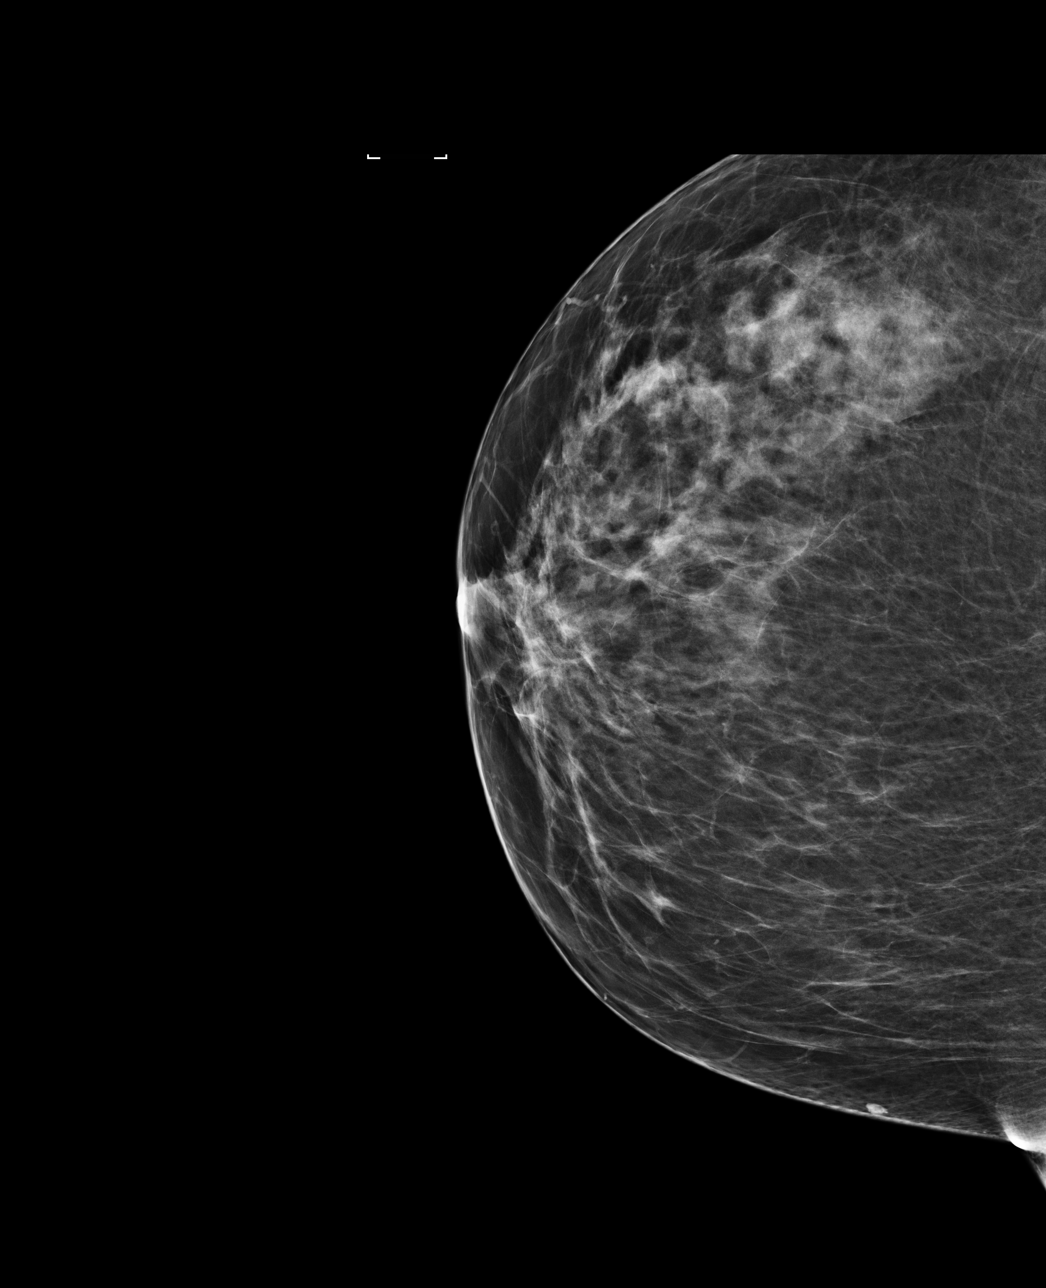

[L MLO]
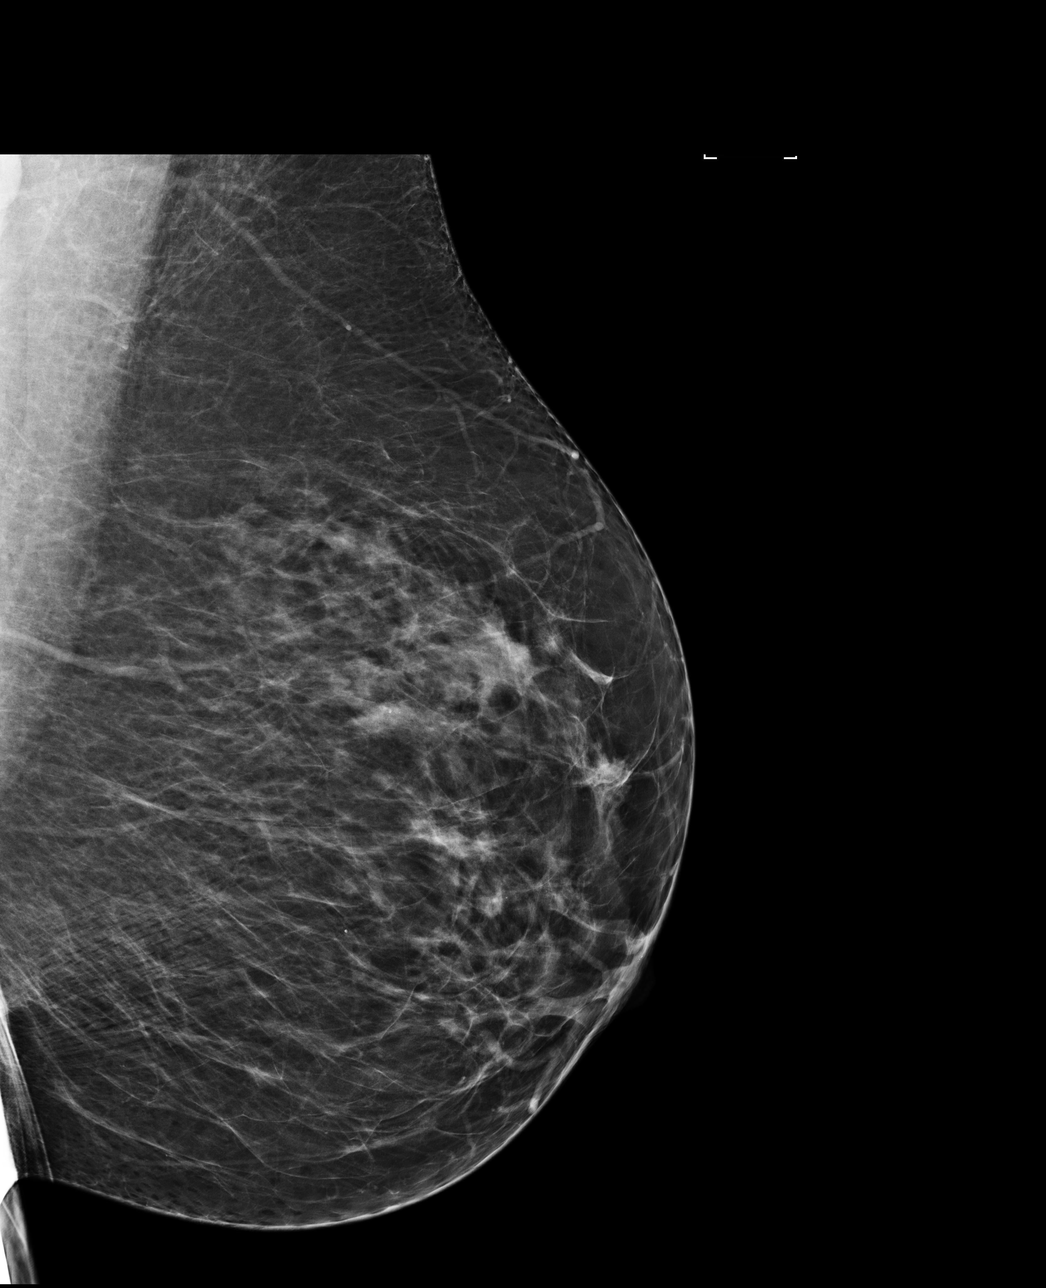

[4 of 4 positions shown; findings below may reference images not displayed]

ACR Breast Density Category c: The breast tissue is heterogeneously
dense, which may obscure small masses.
FINDINGS: There are no findings suspicious for malignancy. Images were
processed with CAD.
IMPRESSION: No mammographic evidence of malignancy. A result letter of this
screening mammogram will be mailed directly to the patient.

RECOMMENDATION:
Screening mammogram in one year. (Code:[id number])

BI-RADS CATEGORY  1: Negative.

## 2020-09-17 ENCOUNTER — Other Ambulatory Visit (HOSPITAL_COMMUNITY): Payer: Self-pay

## 2020-09-17 MED ORDER — AMITRIPTYLINE HCL 50 MG PO TABS
50.0000 mg | ORAL_TABLET | Freq: Every evening | ORAL | 1 refills | Status: DC
  Filled 2020-09-17: qty 90, 90d supply, fill #0
  Filled 2020-12-20: qty 90, 90d supply, fill #1

## 2020-09-18 ENCOUNTER — Other Ambulatory Visit (HOSPITAL_COMMUNITY): Payer: Self-pay

## 2020-10-11 ENCOUNTER — Other Ambulatory Visit (HOSPITAL_COMMUNITY): Payer: Self-pay

## 2020-10-11 MED ORDER — TRAMADOL HCL 50 MG PO TABS
ORAL_TABLET | ORAL | 0 refills | Status: DC
  Filled 2020-10-11: qty 30, 30d supply, fill #0

## 2020-10-11 MED ORDER — OMEPRAZOLE 20 MG PO CPDR
40.0000 mg | DELAYED_RELEASE_CAPSULE | Freq: Every day | ORAL | 0 refills | Status: DC
  Filled 2020-10-11: qty 180, 90d supply, fill #0

## 2020-10-13 ENCOUNTER — Other Ambulatory Visit (HOSPITAL_COMMUNITY): Payer: Self-pay

## 2020-12-20 ENCOUNTER — Other Ambulatory Visit (HOSPITAL_COMMUNITY): Payer: Self-pay

## 2021-01-03 ENCOUNTER — Other Ambulatory Visit (HOSPITAL_COMMUNITY): Payer: Self-pay

## 2021-01-03 MED ORDER — TRAMADOL HCL 50 MG PO TABS
ORAL_TABLET | ORAL | 0 refills | Status: DC
  Filled 2021-01-03: qty 30, 30d supply, fill #0

## 2021-01-20 ENCOUNTER — Other Ambulatory Visit (HOSPITAL_COMMUNITY): Payer: Self-pay

## 2021-01-20 MED ORDER — OMEPRAZOLE 20 MG PO CPDR
40.0000 mg | DELAYED_RELEASE_CAPSULE | Freq: Every day | ORAL | 0 refills | Status: AC
  Filled 2021-01-20: qty 180, 90d supply, fill #0

## 2021-03-07 ENCOUNTER — Other Ambulatory Visit (HOSPITAL_COMMUNITY): Payer: Self-pay

## 2021-03-07 MED ORDER — AMITRIPTYLINE HCL 50 MG PO TABS
50.0000 mg | ORAL_TABLET | Freq: Every day | ORAL | 0 refills | Status: AC
  Filled 2021-03-07: qty 90, 90d supply, fill #0

## 2021-03-17 ENCOUNTER — Other Ambulatory Visit (HOSPITAL_COMMUNITY): Payer: Self-pay

## 2021-03-18 ENCOUNTER — Other Ambulatory Visit (HOSPITAL_COMMUNITY): Payer: Self-pay

## 2021-03-18 MED ORDER — TRAMADOL HCL 50 MG PO TABS
ORAL_TABLET | ORAL | 0 refills | Status: DC
  Filled 2021-03-18: qty 30, 30d supply, fill #0

## 2021-04-29 ENCOUNTER — Other Ambulatory Visit (HOSPITAL_COMMUNITY): Payer: Self-pay

## 2021-04-29 MED ORDER — AMITRIPTYLINE HCL 50 MG PO TABS
50.0000 mg | ORAL_TABLET | Freq: Every evening | ORAL | 3 refills | Status: AC | PRN
  Filled 2021-04-29 – 2021-05-24 (×2): qty 90, 90d supply, fill #0
  Filled 2021-08-23: qty 90, 90d supply, fill #1
  Filled 2021-11-15 – 2021-11-18 (×2): qty 90, 90d supply, fill #2
  Filled 2022-02-07: qty 90, 90d supply, fill #3

## 2021-04-29 MED ORDER — OMEPRAZOLE 20 MG PO CPDR
40.0000 mg | DELAYED_RELEASE_CAPSULE | Freq: Every day | ORAL | 3 refills | Status: DC
  Filled 2021-04-29: qty 180, 90d supply, fill #0
  Filled 2021-08-23: qty 180, 90d supply, fill #1
  Filled 2021-11-15: qty 180, 90d supply, fill #2
  Filled 2022-02-27 – 2022-03-01 (×2): qty 180, 90d supply, fill #3

## 2021-05-17 ENCOUNTER — Other Ambulatory Visit (HOSPITAL_COMMUNITY): Payer: Self-pay

## 2021-05-17 MED ORDER — WEGOVY 0.25 MG/0.5ML ~~LOC~~ SOAJ
SUBCUTANEOUS | 1 refills | Status: AC
  Filled 2021-05-17: qty 2, 28d supply, fill #0
  Filled 2021-06-14: qty 2, 28d supply, fill #1

## 2021-05-20 ENCOUNTER — Other Ambulatory Visit (HOSPITAL_COMMUNITY): Payer: Self-pay

## 2021-05-21 ENCOUNTER — Other Ambulatory Visit (HOSPITAL_COMMUNITY): Payer: Self-pay

## 2021-05-23 ENCOUNTER — Other Ambulatory Visit (HOSPITAL_COMMUNITY): Payer: Self-pay

## 2021-05-23 MED ORDER — TRAMADOL HCL 50 MG PO TABS
ORAL_TABLET | ORAL | 0 refills | Status: AC
  Filled 2021-05-23: qty 30, 30d supply, fill #0

## 2021-05-24 ENCOUNTER — Other Ambulatory Visit (HOSPITAL_COMMUNITY): Payer: Self-pay

## 2021-05-25 ENCOUNTER — Other Ambulatory Visit (HOSPITAL_COMMUNITY): Payer: Self-pay

## 2021-05-28 ENCOUNTER — Other Ambulatory Visit (HOSPITAL_COMMUNITY): Payer: Self-pay

## 2021-06-13 ENCOUNTER — Other Ambulatory Visit (HOSPITAL_COMMUNITY): Payer: Self-pay

## 2021-06-13 MED ORDER — WEGOVY 0.5 MG/0.5ML ~~LOC~~ SOAJ
SUBCUTANEOUS | 1 refills | Status: DC
  Filled 2021-06-13 – 2021-07-06 (×2): qty 2, 28d supply, fill #0
  Filled 2021-08-04: qty 2, 28d supply, fill #1

## 2021-06-14 ENCOUNTER — Other Ambulatory Visit (HOSPITAL_COMMUNITY): Payer: Self-pay

## 2021-07-06 ENCOUNTER — Other Ambulatory Visit (HOSPITAL_COMMUNITY): Payer: Self-pay

## 2021-07-12 ENCOUNTER — Other Ambulatory Visit (HOSPITAL_COMMUNITY): Payer: Self-pay

## 2021-07-18 ENCOUNTER — Other Ambulatory Visit (HOSPITAL_COMMUNITY): Payer: Self-pay

## 2021-07-28 ENCOUNTER — Other Ambulatory Visit (HOSPITAL_COMMUNITY): Payer: Self-pay

## 2021-07-29 ENCOUNTER — Other Ambulatory Visit (HOSPITAL_COMMUNITY): Payer: Self-pay

## 2021-07-29 MED ORDER — TRAMADOL HCL 50 MG PO TABS
ORAL_TABLET | ORAL | 0 refills | Status: AC
  Filled 2021-07-29: qty 30, 30d supply, fill #0

## 2021-07-30 ENCOUNTER — Other Ambulatory Visit (HOSPITAL_COMMUNITY): Payer: Self-pay

## 2021-08-04 ENCOUNTER — Other Ambulatory Visit (HOSPITAL_COMMUNITY): Payer: Self-pay

## 2021-08-23 ENCOUNTER — Other Ambulatory Visit (HOSPITAL_COMMUNITY): Payer: Self-pay

## 2021-09-05 ENCOUNTER — Other Ambulatory Visit (HOSPITAL_COMMUNITY): Payer: Self-pay

## 2021-09-07 ENCOUNTER — Other Ambulatory Visit (HOSPITAL_COMMUNITY): Payer: Self-pay

## 2021-09-08 ENCOUNTER — Other Ambulatory Visit (HOSPITAL_COMMUNITY): Payer: Self-pay

## 2021-09-08 MED ORDER — WEGOVY 0.5 MG/0.5ML ~~LOC~~ SOAJ
SUBCUTANEOUS | 0 refills | Status: DC
Start: 2021-09-07 — End: 2021-10-08
  Filled 2021-09-08: qty 2, 28d supply, fill #0

## 2021-09-13 ENCOUNTER — Other Ambulatory Visit (HOSPITAL_COMMUNITY): Payer: Self-pay

## 2021-10-07 ENCOUNTER — Other Ambulatory Visit (HOSPITAL_COMMUNITY): Payer: Self-pay

## 2021-10-08 ENCOUNTER — Other Ambulatory Visit (HOSPITAL_COMMUNITY): Payer: Self-pay

## 2021-10-08 MED ORDER — WEGOVY 0.5 MG/0.5ML ~~LOC~~ SOAJ
SUBCUTANEOUS | 0 refills | Status: AC
  Filled 2021-10-08: qty 2, 28d supply, fill #0

## 2021-10-13 ENCOUNTER — Other Ambulatory Visit (HOSPITAL_COMMUNITY): Payer: Self-pay

## 2021-10-13 MED ORDER — WEGOVY 1 MG/0.5ML ~~LOC~~ SOAJ
0.5000 mL | SUBCUTANEOUS | 1 refills | Status: AC
  Filled 2021-10-13: qty 2, 28d supply, fill #0
  Filled 2021-11-14 – 2021-11-21 (×2): qty 2, 28d supply, fill #1

## 2021-10-17 ENCOUNTER — Other Ambulatory Visit (HOSPITAL_COMMUNITY): Payer: Self-pay

## 2021-10-18 ENCOUNTER — Other Ambulatory Visit (HOSPITAL_COMMUNITY): Payer: Self-pay

## 2021-10-22 ENCOUNTER — Other Ambulatory Visit (HOSPITAL_COMMUNITY): Payer: Self-pay

## 2021-10-24 ENCOUNTER — Other Ambulatory Visit (HOSPITAL_COMMUNITY): Payer: Self-pay

## 2021-10-24 MED ORDER — TRAMADOL HCL 50 MG PO TABS
50.0000 mg | ORAL_TABLET | ORAL | 0 refills | Status: AC
  Filled 2021-10-24: qty 30, 5d supply, fill #0

## 2021-10-29 ENCOUNTER — Other Ambulatory Visit (HOSPITAL_COMMUNITY): Payer: Self-pay

## 2021-11-09 ENCOUNTER — Other Ambulatory Visit (HOSPITAL_COMMUNITY): Payer: Self-pay

## 2021-11-14 ENCOUNTER — Other Ambulatory Visit (HOSPITAL_COMMUNITY): Payer: Self-pay

## 2021-11-15 ENCOUNTER — Other Ambulatory Visit (HOSPITAL_COMMUNITY): Payer: Self-pay

## 2021-11-18 ENCOUNTER — Other Ambulatory Visit (HOSPITAL_COMMUNITY): Payer: Self-pay

## 2021-11-21 ENCOUNTER — Other Ambulatory Visit (HOSPITAL_COMMUNITY): Payer: Self-pay

## 2021-11-21 ENCOUNTER — Other Ambulatory Visit (HOSPITAL_BASED_OUTPATIENT_CLINIC_OR_DEPARTMENT_OTHER): Payer: Self-pay

## 2021-12-12 ENCOUNTER — Other Ambulatory Visit (HOSPITAL_COMMUNITY): Payer: Self-pay

## 2021-12-12 ENCOUNTER — Other Ambulatory Visit (HOSPITAL_BASED_OUTPATIENT_CLINIC_OR_DEPARTMENT_OTHER): Payer: Self-pay

## 2021-12-12 MED ORDER — WEGOVY 1 MG/0.5ML ~~LOC~~ SOAJ
1.0000 mg | SUBCUTANEOUS | 1 refills | Status: AC
Start: 2021-12-12 — End: ?
  Filled 2021-12-12 – 2021-12-13 (×3): qty 2, 28d supply, fill #0
  Filled 2022-01-10: qty 2, 28d supply, fill #1

## 2021-12-13 ENCOUNTER — Other Ambulatory Visit (HOSPITAL_BASED_OUTPATIENT_CLINIC_OR_DEPARTMENT_OTHER): Payer: Self-pay

## 2021-12-20 ENCOUNTER — Other Ambulatory Visit (HOSPITAL_COMMUNITY): Payer: Self-pay

## 2021-12-21 ENCOUNTER — Other Ambulatory Visit (HOSPITAL_COMMUNITY): Payer: Self-pay

## 2021-12-21 MED ORDER — TRAMADOL HCL 50 MG PO TABS
50.0000 mg | ORAL_TABLET | ORAL | 0 refills | Status: DC | PRN
  Filled 2021-12-21: qty 30, 5d supply, fill #0

## 2022-01-10 ENCOUNTER — Other Ambulatory Visit (HOSPITAL_COMMUNITY): Payer: Self-pay

## 2022-02-07 ENCOUNTER — Other Ambulatory Visit (HOSPITAL_COMMUNITY): Payer: Self-pay

## 2022-02-13 ENCOUNTER — Other Ambulatory Visit (HOSPITAL_COMMUNITY): Payer: Self-pay

## 2022-02-15 ENCOUNTER — Other Ambulatory Visit (HOSPITAL_COMMUNITY): Payer: Self-pay

## 2022-02-16 ENCOUNTER — Other Ambulatory Visit (HOSPITAL_BASED_OUTPATIENT_CLINIC_OR_DEPARTMENT_OTHER): Payer: Self-pay

## 2022-02-16 ENCOUNTER — Other Ambulatory Visit (HOSPITAL_COMMUNITY): Payer: Self-pay

## 2022-02-16 MED ORDER — WEGOVY 1 MG/0.5ML ~~LOC~~ SOAJ
1.0000 mg | SUBCUTANEOUS | 4 refills | Status: AC
  Filled 2022-02-16: qty 2, 28d supply, fill #0
  Filled 2022-03-13: qty 2, 28d supply, fill #1
  Filled 2022-04-10 (×2): qty 2, 28d supply, fill #2
  Filled 2022-05-21: qty 2, 28d supply, fill #3

## 2022-02-27 ENCOUNTER — Other Ambulatory Visit (HOSPITAL_COMMUNITY): Payer: Self-pay

## 2022-03-01 ENCOUNTER — Other Ambulatory Visit (HOSPITAL_COMMUNITY): Payer: Self-pay

## 2022-03-13 ENCOUNTER — Other Ambulatory Visit (HOSPITAL_COMMUNITY): Payer: Self-pay

## 2022-03-20 ENCOUNTER — Other Ambulatory Visit (HOSPITAL_COMMUNITY): Payer: Self-pay

## 2022-03-21 ENCOUNTER — Other Ambulatory Visit: Payer: Self-pay

## 2022-03-21 ENCOUNTER — Other Ambulatory Visit (HOSPITAL_COMMUNITY): Payer: Self-pay

## 2022-03-21 MED ORDER — TRAMADOL HCL 50 MG PO TABS
50.0000 mg | ORAL_TABLET | ORAL | 0 refills | Status: DC | PRN
  Filled 2022-03-21 (×2): qty 30, 5d supply, fill #0

## 2022-04-10 ENCOUNTER — Other Ambulatory Visit (HOSPITAL_COMMUNITY): Payer: Self-pay

## 2022-05-01 ENCOUNTER — Other Ambulatory Visit (HOSPITAL_COMMUNITY): Payer: Self-pay

## 2022-05-04 ENCOUNTER — Other Ambulatory Visit (HOSPITAL_COMMUNITY): Payer: Self-pay

## 2022-05-04 MED ORDER — AMITRIPTYLINE HCL 50 MG PO TABS
50.0000 mg | ORAL_TABLET | Freq: Every evening | ORAL | 0 refills | Status: DC
  Filled 2022-05-04: qty 90, 90d supply, fill #0

## 2022-05-05 ENCOUNTER — Other Ambulatory Visit (HOSPITAL_COMMUNITY): Payer: Self-pay

## 2022-05-05 ENCOUNTER — Other Ambulatory Visit: Payer: Self-pay

## 2022-05-05 DIAGNOSIS — M5414 Radiculopathy, thoracic region: Secondary | ICD-10-CM | POA: Diagnosis not present

## 2022-05-05 DIAGNOSIS — Z6830 Body mass index (BMI) 30.0-30.9, adult: Secondary | ICD-10-CM | POA: Diagnosis not present

## 2022-05-05 MED ORDER — PREGABALIN 50 MG PO CAPS
50.0000 mg | ORAL_CAPSULE | Freq: Two times a day (BID) | ORAL | 0 refills | Status: DC
  Filled 2022-05-05: qty 60, 30d supply, fill #0

## 2022-05-08 ENCOUNTER — Other Ambulatory Visit (HOSPITAL_COMMUNITY): Payer: Self-pay

## 2022-05-08 ENCOUNTER — Other Ambulatory Visit: Payer: Self-pay

## 2022-05-09 ENCOUNTER — Other Ambulatory Visit: Payer: Self-pay

## 2022-05-09 ENCOUNTER — Other Ambulatory Visit (HOSPITAL_COMMUNITY): Payer: Self-pay

## 2022-05-09 ENCOUNTER — Other Ambulatory Visit (HOSPITAL_COMMUNITY): Payer: Self-pay | Admitting: Neurological Surgery

## 2022-05-09 DIAGNOSIS — M5414 Radiculopathy, thoracic region: Secondary | ICD-10-CM

## 2022-05-09 MED ORDER — TRAMADOL HCL 50 MG PO TABS
50.0000 mg | ORAL_TABLET | ORAL | 0 refills | Status: DC | PRN
  Filled 2022-05-09: qty 30, 5d supply, fill #0

## 2022-05-16 ENCOUNTER — Ambulatory Visit (HOSPITAL_COMMUNITY)
Admission: RE | Admit: 2022-05-16 | Discharge: 2022-05-16 | Disposition: A | Discharge status: Home / Self Care | Payer: 59 | Source: Ambulatory Visit | Attending: Neurological Surgery | Admitting: Neurological Surgery

## 2022-05-16 DIAGNOSIS — M5414 Radiculopathy, thoracic region: Secondary | ICD-10-CM | POA: Insufficient documentation

## 2022-05-22 ENCOUNTER — Other Ambulatory Visit (HOSPITAL_COMMUNITY): Payer: Self-pay

## 2022-05-30 ENCOUNTER — Other Ambulatory Visit (HOSPITAL_COMMUNITY): Payer: Self-pay

## 2022-05-31 ENCOUNTER — Other Ambulatory Visit (HOSPITAL_COMMUNITY): Payer: Self-pay

## 2022-05-31 ENCOUNTER — Other Ambulatory Visit: Payer: Self-pay

## 2022-05-31 DIAGNOSIS — M5124 Other intervertebral disc displacement, thoracic region: Secondary | ICD-10-CM | POA: Diagnosis not present

## 2022-05-31 MED ORDER — OMEPRAZOLE 20 MG PO CPDR
40.0000 mg | DELAYED_RELEASE_CAPSULE | Freq: Every day | ORAL | 0 refills | Status: DC
  Filled 2022-05-31: qty 180, 90d supply, fill #0

## 2022-06-05 ENCOUNTER — Other Ambulatory Visit (HOSPITAL_COMMUNITY): Payer: Self-pay

## 2022-06-05 ENCOUNTER — Other Ambulatory Visit: Payer: Self-pay

## 2022-06-05 DIAGNOSIS — Z6831 Body mass index (BMI) 31.0-31.9, adult: Secondary | ICD-10-CM | POA: Diagnosis not present

## 2022-06-05 DIAGNOSIS — Z0000 Encounter for general adult medical examination without abnormal findings: Secondary | ICD-10-CM | POA: Diagnosis not present

## 2022-06-05 DIAGNOSIS — M546 Pain in thoracic spine: Secondary | ICD-10-CM | POA: Diagnosis not present

## 2022-06-05 DIAGNOSIS — G4700 Insomnia, unspecified: Secondary | ICD-10-CM | POA: Diagnosis not present

## 2022-06-05 DIAGNOSIS — R030 Elevated blood-pressure reading, without diagnosis of hypertension: Secondary | ICD-10-CM | POA: Diagnosis not present

## 2022-06-05 DIAGNOSIS — K219 Gastro-esophageal reflux disease without esophagitis: Secondary | ICD-10-CM | POA: Diagnosis not present

## 2022-06-05 DIAGNOSIS — K7689 Other specified diseases of liver: Secondary | ICD-10-CM | POA: Diagnosis not present

## 2022-06-05 DIAGNOSIS — D509 Iron deficiency anemia, unspecified: Secondary | ICD-10-CM | POA: Diagnosis not present

## 2022-06-05 DIAGNOSIS — E7800 Pure hypercholesterolemia, unspecified: Secondary | ICD-10-CM | POA: Diagnosis not present

## 2022-06-05 DIAGNOSIS — Z1211 Encounter for screening for malignant neoplasm of colon: Secondary | ICD-10-CM | POA: Diagnosis not present

## 2022-06-05 MED ORDER — OMEPRAZOLE 20 MG PO CPDR
40.0000 mg | DELAYED_RELEASE_CAPSULE | Freq: Every day | ORAL | 3 refills | Status: DC
  Filled 2022-06-05: qty 180, 180d supply, fill #0
  Filled 2022-08-23: qty 180, 90d supply, fill #0
  Filled 2022-09-13 (×2): qty 180, 90d supply, fill #1
  Filled 2023-04-24: qty 180, 90d supply, fill #2

## 2022-06-05 MED ORDER — AMITRIPTYLINE HCL 50 MG PO TABS
50.0000 mg | ORAL_TABLET | Freq: Every day | ORAL | 3 refills | Status: DC
  Filled 2022-06-05 – 2022-08-01 (×2): qty 3, 3d supply, fill #0

## 2022-06-06 ENCOUNTER — Other Ambulatory Visit (HOSPITAL_COMMUNITY): Payer: Self-pay

## 2022-06-07 ENCOUNTER — Telehealth: Payer: Self-pay | Admitting: Internal Medicine

## 2022-06-07 ENCOUNTER — Other Ambulatory Visit (HOSPITAL_COMMUNITY): Payer: Self-pay

## 2022-06-07 NOTE — Telephone Encounter (Signed)
Pt scheduled for previsit 06/27/22 at 11am, colon scheduled in the [facility] 07/17/22 at 10am. Pt aware of appt. She already has OV scheduled to discuss liver cyst.

## 2022-06-07 NOTE — Telephone Encounter (Signed)
Patient call to schedule colonoscopy. She is requesting a call back to discuss scheduling her colonoscopy due to having a MRI with her PCP and a cyst on her liver was found. States that the MRI results were sent over to Dr. Rhea Belton for review. She is requesting a call back to discuss this. Please advise, thank you.

## 2022-06-08 ENCOUNTER — Other Ambulatory Visit: Payer: Self-pay

## 2022-06-08 ENCOUNTER — Other Ambulatory Visit (HOSPITAL_COMMUNITY): Payer: Self-pay

## 2022-06-08 MED ORDER — PREGABALIN 50 MG PO CAPS
50.0000 mg | ORAL_CAPSULE | Freq: Two times a day (BID) | ORAL | 0 refills | Status: AC
  Filled 2022-06-08: qty 60, 30d supply, fill #0

## 2022-06-09 ENCOUNTER — Other Ambulatory Visit (HOSPITAL_COMMUNITY): Payer: Self-pay

## 2022-06-27 ENCOUNTER — Other Ambulatory Visit: Payer: Self-pay

## 2022-06-27 ENCOUNTER — Ambulatory Visit (AMBULATORY_SURGERY_CENTER): Payer: 59

## 2022-06-27 ENCOUNTER — Other Ambulatory Visit (HOSPITAL_COMMUNITY): Payer: Self-pay

## 2022-06-27 VITALS — Ht 62.0 in | Wt 160.0 lb

## 2022-06-27 DIAGNOSIS — Z1211 Encounter for screening for malignant neoplasm of colon: Secondary | ICD-10-CM

## 2022-06-27 DIAGNOSIS — Z800 Family history of malignant neoplasm of digestive organs: Secondary | ICD-10-CM

## 2022-06-27 MED ORDER — NA SULFATE-K SULFATE-MG SULF 17.5-3.13-1.6 GM/177ML PO SOLN
1.0000 | Freq: Once | ORAL | 0 refills | Status: AC
Start: 2022-06-27 — End: 2022-06-28
  Filled 2022-06-27: qty 354, 1d supply, fill #0

## 2022-06-27 NOTE — Progress Notes (Signed)

## 2022-07-01 ENCOUNTER — Inpatient Hospital Stay: Payer: 59 | Attending: Hematology | Admitting: Hematology

## 2022-07-01 ENCOUNTER — Inpatient Hospital Stay: Payer: 59

## 2022-07-01 VITALS — BP 146/98 | HR 75 | Temp 100.6°F | Resp 18 | Ht 62.0 in | Wt 168.9 lb

## 2022-07-01 DIAGNOSIS — K219 Gastro-esophageal reflux disease without esophagitis: Secondary | ICD-10-CM | POA: Diagnosis not present

## 2022-07-01 DIAGNOSIS — Z9884 Bariatric surgery status: Secondary | ICD-10-CM | POA: Diagnosis not present

## 2022-07-01 DIAGNOSIS — D509 Iron deficiency anemia, unspecified: Secondary | ICD-10-CM

## 2022-07-01 DIAGNOSIS — K449 Diaphragmatic hernia without obstruction or gangrene: Secondary | ICD-10-CM | POA: Diagnosis not present

## 2022-07-01 DIAGNOSIS — Z79899 Other long term (current) drug therapy: Secondary | ICD-10-CM | POA: Insufficient documentation

## 2022-07-01 DIAGNOSIS — R5383 Other fatigue: Secondary | ICD-10-CM | POA: Diagnosis not present

## 2022-07-01 LAB — CMP (CANCER CENTER ONLY)
ALT: 10 U/L (ref 0–44)
AST: 18 U/L (ref 15–41)
Albumin: 4.5 g/dL (ref 3.5–5.0)
Alkaline Phosphatase: 82 U/L (ref 38–126)
Anion gap: 6 (ref 5–15)
BUN: 16 mg/dL (ref 6–20)
CO2: 28 mmol/L (ref 22–32)
Calcium: 9.7 mg/dL (ref 8.9–10.3)
Chloride: 105 mmol/L (ref 98–111)
Creatinine: 0.82 mg/dL (ref 0.44–1.00)
GFR, Estimated: >60 mL/min (ref >60)
Glucose, Bld: 96 mg/dL (ref 70–99)
Potassium: 4.3 mmol/L (ref 3.5–5.1)
Sodium: 139 mmol/L (ref 135–145)
Total Bilirubin: 0.4 mg/dL (ref 0.3–1.2)
Total Protein: 7.8 g/dL (ref 6.5–8.1)

## 2022-07-01 LAB — CBC WITH DIFFERENTIAL (CANCER CENTER ONLY)
Abs Immature Granulocytes: 0.01 10*3/uL (ref 0.00–0.07)
Basophils Absolute: 0 10*3/uL (ref 0.0–0.1)
Basophils Relative: 1 %
Eosinophils Absolute: 0.1 10*3/uL (ref 0.0–0.5)
Eosinophils Relative: 3 %
HCT: 36.8 % (ref 36.0–46.0)
Hemoglobin: 11.6 g/dL — ABNORMAL LOW (ref 12.0–15.0)
Immature Granulocytes: 0 %
Lymphocytes Relative: 28 %
Lymphs Abs: 1.1 10*3/uL (ref 0.7–4.0)
MCH: 24.7 pg — ABNORMAL LOW (ref 26.0–34.0)
MCHC: 31.5 g/dL (ref 30.0–36.0)
MCV: 78.3 fL — ABNORMAL LOW (ref 80.0–100.0)
Monocytes Absolute: 0.2 10*3/uL (ref 0.1–1.0)
Monocytes Relative: 6 %
Neutro Abs: 2.4 10*3/uL (ref 1.7–7.7)
Neutrophils Relative %: 62 %
Platelet Count: 268 10*3/uL (ref 150–400)
RBC: 4.7 MIL/uL (ref 3.87–5.11)
RDW: 15.4 % (ref 11.5–15.5)
WBC Count: 3.9 10*3/uL — ABNORMAL LOW (ref 4.0–10.5)
nRBC: 0 % (ref 0.0–0.2)

## 2022-07-01 LAB — VITAMIN B12: Vitamin B-12: 268 pg/mL (ref 180–914)

## 2022-07-01 LAB — IRON AND IRON BINDING CAPACITY (CC-WL,HP ONLY)
Iron: 40 ug/dL (ref 28–170)
Saturation Ratios: 9 % — ABNORMAL LOW (ref 10.4–31.8)
TIBC: 473 ug/dL — ABNORMAL HIGH (ref 250–450)
UIBC: 433 ug/dL (ref 148–442)

## 2022-07-01 LAB — VITAMIN D 25 HYDROXY (VIT D DEFICIENCY, FRACTURES): Vit D, 25-Hydroxy: 19.98 ng/mL — ABNORMAL LOW (ref 30–100)

## 2022-07-01 LAB — FERRITIN: Ferritin: 4 ng/mL — ABNORMAL LOW (ref 11–307)

## 2022-07-01 NOTE — Progress Notes (Signed)
[facility]   HEMATOLOGY/ONCOLOGY CONSULTATION NOTE  Date of Service: 07/01/2022  Patient Care Team: Tally Joe, MD as PCP - General (Family Medicine)  CHIEF COMPLAINTS/PURPOSE OF CONSULTATION:  Iron deficiency Anemia   HISTORY OF PRESENTING ILLNESS:   Heidi Jordan is a wonderful 57 y.o. female who has been referred to [place] by Dr Tally Joe, MD for evaluation and management of Iron deficiency anemia.  Patient has a h/o Iron deficiency anemia, hiatal hernia, obesity (s/p Lap band surgery), GERD, hermorrhoids, chronic back pain who recently had labs with her PCP on 06/05/2022 which showed hgb of 10.6 mcv of 75, ferritin of 4.5. Previous labs in 04/2021 showed hgb about 11 with ferritin of 11.  Patient notes she has had chronic iron deficiency and has been on PO iron intermittently . Has needed to be on chronic PPI for Hiatal hernia with reflux.  Previous GI workup in 2019 with Dr Erick Blinks with EGD showing  4cm hiatal hernia and lap band. Colonoscopy - showed internal and external hemorrhoids , no other significant pathology.  Patient denies significant new abdominal pain. No significant change in bowel habits. No overt GI bleeding. Has been on wegovy for weight loss. Does endorse fatigue.  MEDICAL HISTORY:  Past Medical History:  Diagnosis Date   Anemia    Chronic back pain    tx with ibuprofen   Chronic tension headaches    External hemorrhoids    GERD (gastroesophageal reflux disease)    Hiatal hernia    Insomnia    tx with amitriptyline   Internal hemorrhoids    Iron deficiency    Laryngopharyngeal reflux    Missed abortion    no surgery reuqired   Obesity     SURGICAL HISTORY: Past Surgical History:  Procedure Laterality Date   BREAST REDUCTION SURGERY Bilateral 11/17/2019   Procedure: BREAST REDUCTION WITH LIPOSUCTION;  Surgeon: Peggye Form, DO;  Location: [facility];  Service: Plastics;  Laterality: Bilateral;   CESAREAN SECTION     x 2    COLONOSCOPY     DIAGNOSTIC LAPAROSCOPY     x 2 surg,for ectopic preg w/ removal of tube   DIAGNOSTIC LAPAROSCOPY     endometriosis - removed tube   fibroid tumor removal     HYSTEROSCOPY WITH THERMACHOICE  06/14/2011   Procedure: HYSTEROSCOPY WITH THERMACHOICE;  Surgeon: Geryl Rankins, MD;  Location: [facility];  Service: Gynecology;;  Donnie Aho choice started at 1413   Lap Band surgery  2004   LIPOSUCTION N/A 01/07/2020   Procedure: LIPOSUCTION;  Surgeon: Peggye Form, DO;  Location: [facility];  Service: Plastics;  Laterality: N/A;   PANNICULECTOMY N/A 01/07/2020   Procedure: PANNICULECTOMY;  Surgeon: Peggye Form, DO;  Location: [facility];  Service: Plastics;  Laterality: N/A;  3 hours, please   UPPER GASTROINTESTINAL ENDOSCOPY     WISDOM TOOTH EXTRACTION      SOCIAL HISTORY: Social History   Socioeconomic History   Marital status: Married    Spouse name: Not on file   Number of children: Not on file   Years of education: Not on file   Highest education level: Not on file  Occupational History   Not on file  Tobacco Use   Smoking status: Never   Smokeless tobacco: Never  Substance and Sexual Activity   Alcohol use: Yes    Comment: socially   Drug use: No   Sexual activity: Yes    Birth control/protection:  None  Other Topics Concern   Not on file  Social History Narrative   Not on file   Social Determinants of Health   Financial Resource Strain: Not on file  Food Insecurity: Not on file  Transportation Needs: Not on file  Physical Activity: Not on file  Stress: Not on file  Social Connections: Not on file  Intimate Partner Violence: Not on file    FAMILY HISTORY: Family History  Problem Relation Age of Onset   Breast cancer Maternal Grandmother    Colon cancer Mother        in her 58s   Renal cancer Mother    Colon polyps Mother    Hypertension Father    Skin cancer Father    Lymphoma Father    Esophageal  cancer Father    Stomach cancer Maternal Grandfather    Brain cancer Paternal Grandmother    Esophageal cancer Paternal Grandfather    Rectal cancer Neg Hx     ALLERGIES:  is allergic to sulfa drugs cross reactors.  MEDICATIONS:  Current Outpatient Medications  Medication Sig Dispense Refill   amitriptyline (ELAVIL) 50 MG tablet Take 50 mg by mouth at bedtime.     amitriptyline (ELAVIL) 50 MG tablet TAKE 1 TABLET BY MOUTH AT BEDTIME 90 tablet 3   amitriptyline (ELAVIL) 50 MG tablet Take 1 tablet by mouth at bedtime. 90 tablet 0   amitriptyline (ELAVIL) 50 MG tablet Take 1 tablet (50 mg total) by mouth once a day at bedtime as needed. 90 tablet 3   amitriptyline (ELAVIL) 50 MG tablet Take 1 tablet (50 mg total) by mouth daily. 3 tablet 3   omeprazole (PRILOSEC) 20 MG capsule TAKE 2 CAPSULES BY MOUTH DAILY 180 capsule 3   omeprazole (PRILOSEC) 20 MG capsule Take 2 capsules by mouth daily. 180 capsule 0   omeprazole (PRILOSEC) 20 MG capsule Take 2 capsules (40 mg total) by mouth daily. 180 capsule 3   omeprazole (PRILOSEC) 40 MG capsule Take 40 mg by mouth daily.     pregabalin (LYRICA) 50 MG capsule Take 1 capsule (50 mg total) by mouth 2 (two) times daily. 60 capsule 0   Semaglutide-Weight Management (WEGOVY) 0.25 MG/0.5ML SOAJ Inject 0.5 mL into the skin once a week 2 mL 1   Semaglutide-Weight Management (WEGOVY) 0.5 MG/0.5ML SOAJ Inject 0.5 mg into the skin once a week as directed. 2 mL 0   Semaglutide-Weight Management (WEGOVY) 1 MG/0.5ML SOAJ Inject 0.5 mL subcutaneously once a week as directed. 2 mL 1   Semaglutide-Weight Management (WEGOVY) 1 MG/0.5ML SOAJ Inject 1 mg into the skin once a week. 2 mL 1   Semaglutide-Weight Management (WEGOVY) 1 MG/0.5ML SOAJ Inject 1 mg into the skin every 7 (seven) days. 2 mL 4   traMADol (ULTRAM) 50 MG tablet TAKE 1 TABLET BY MOUTH EVERY 6 HOURS AS NEEDED 10 tablet 0   traMADol (ULTRAM) 50 MG tablet Take 1 tablet (50 mg total) by mouth every 4  (four) hours as needed. 30 tablet 0   traMADol (ULTRAM) 50 MG tablet Take 1 tablet by mouth every 4 hours as needed. (30 day supply per MD) 30 tablet 0   traMADol (ULTRAM) 50 MG tablet Take 1 tablet by mouth every 4 hours as needed (30 day supply) 30 tablet 0   traMADol (ULTRAM) 50 MG tablet Take 1 tablet (50 mg total) by mouth every 4 (four) hours as needed. 30 tablet 0   traMADol (ULTRAM) 50 MG tablet Take  1 tablet (50 mg total) by mouth every 4 (four) hours as needed. 30 tablet 0   No current facility-administered medications for this visit.    REVIEW OF SYSTEMS:    10 Point review of Systems was done is negative except as noted above.  PHYSICAL EXAMINATION: ECOG PERFORMANCE STATUS: 1 - Symptomatic but completely ambulatory  . Vitals:   07/01/22 0931  BP: (!) 146/98  Pulse: 75  Resp: 18  Temp: (!) 100.6 F (38.1 C)  SpO2: 99%   Filed Weights   07/01/22 0931  Weight: 168 lb 14.4 oz (76.6 kg)   .Body mass index is 30.89 kg/m.  GENERAL:alert, in no acute distress and comfortable SKIN: no acute rashes, no significant lesions EYES: conjunctiva are pink and non-injected, sclera anicteric OROPHARYNX: MMM, no exudates, no oropharyngeal erythema or ulceration NECK: supple, no JVD LYMPH:  no palpable lymphadenopathy in the cervical, axillary or inguinal regions LUNGS: clear to auscultation b/l with normal respiratory effort HEART: regular rate & rhythm ABDOMEN:  normoactive bowel sounds , non tender, not distended. No palpable hepatosplenomegaly. Extremity: no pedal edema PSYCH: alert & oriented x 3 with fluent speech NEURO: no focal motor/sensory deficits  LABORATORY DATA:  I have reviewed the data as listed  .    Latest Ref Rng & Units 07/01/2022   10:35 AM 07/18/2017    3:48 PM 03/13/2014    8:12 AM  CBC  WBC 4.0 - 10.5 K/uL 3.9  5.1  3.3   Hemoglobin 12.0 - 15.0 g/dL 16.1  09.6  04.5   Hematocrit 36.0 - 46.0 % 36.8  38.5  37.5   Platelets 150 - 400 K/uL 268  277.0   234    .CBC    Component Value Date/Time   WBC 3.9 (L) 07/01/2022 1035   WBC 5.1 07/18/2017 1548   RBC 4.70 07/01/2022 1035   HGB 11.6 (L) 07/01/2022 1035   HCT 36.8 07/01/2022 1035   PLT 268 07/01/2022 1035   MCV 78.3 (L) 07/01/2022 1035   MCH 24.7 (L) 07/01/2022 1035   MCHC 31.5 07/01/2022 1035   RDW 15.4 07/01/2022 1035   LYMPHSABS 1.1 07/01/2022 1035   MONOABS 0.2 07/01/2022 1035   EOSABS 0.1 07/01/2022 1035   BASOSABS 0.0 07/01/2022 1035    .    Latest Ref Rng & Units 07/01/2022   10:35 AM 03/13/2014    8:12 AM  CMP  Glucose 70 - 99 mg/dL 96  85   BUN 6 - 20 mg/dL 16  14   Creatinine 4.09 - 1.00 mg/dL 8.11  9.14   Sodium 782 - 145 mmol/L 139  135   Potassium 3.5 - 5.1 mmol/L 4.3  4.0   Chloride 98 - 111 mmol/L 105  103   CO2 22 - 32 mmol/L 28  26   Calcium 8.9 - 10.3 mg/dL 9.7  8.6   Total Protein 6.5 - 8.1 g/dL 7.8    Total Bilirubin 0.3 - 1.2 mg/dL 0.4    Alkaline Phos 38 - 126 U/L 82    AST 15 - 41 U/L 18    ALT 0 - 44 U/L 10     . Lab Results  Component Value Date   IRON 40 07/01/2022   TIBC 473 (H) 07/01/2022   IRONPCTSAT 9 (L) 07/01/2022   (Iron and TIBC)  Lab Results  Component Value Date   FERRITIN 4 (L) 07/01/2022     RADIOGRAPHIC STUDIES: I have personally reviewed the radiological images as  listed and agreed with the findings in the report. No results found.  ASSESSMENT & PLAN:   57 yo with   1) Severe Iron deficiency Likely due to chronic iron malabsorption from chronic PPI use. Cannot r/o GI losses but no overt bleeding noted Reviewed with Patient previous EGD in 2019 showed 4 cm hiatal hernia which can increase risk of ulcers. COlonoscopy 2019 -- internal and external hemorrhoids. No other significant lesions. Patient endorses that she consumes a balanced diet. Has had lap band procedure for weight loss and is currently on wegovy.  2) Microcytic Anemia related to iron deficiency. Patient notes significant  fatigue.  PLAN -discussed results from available labs. -discussed her h/o chronic anemia and previous GI workup -discussed potential etiologies of iron deficiency in her case. -check B12 to r/o associated deficiency with chronic PPI use. -discussed options for continue po iron vs consideration of IV Iron due to poor iron absorption and to limited addition GI side effects. -patient is agreeable to IV iron replacement and we shall order and set this up. -f/u with PCP/Dr Pyrtle to determine need to additional GI workup ? Capsule endoscopy. -recommended to take B complex for atleast 3 months to address any sublte B vitamin deficiencies that might manifest with accelerated hematopoiesis after IV Iron  Followup Labs today IV Feraheme weekly x 2 doses ASAP RTC with Dr Candise Che with labs in 3 months   . Orders Placed This Encounter  Procedures   CBC with Differential (Cancer Center Only)    Standing Status:   Future    Number of Occurrences:   1    Standing Expiration Date:   07/01/2023   Ferritin    Standing Status:   Future    Number of Occurrences:   1    Standing Expiration Date:   07/01/2023   Vitamin B12    Standing Status:   Future    Number of Occurrences:   1    Standing Expiration Date:   07/01/2023   Vitamin D 25 hydroxy    Standing Status:   Future    Number of Occurrences:   1    Standing Expiration Date:   07/01/2023   CMP (Cancer Center only)    Standing Status:   Future    Number of Occurrences:   1    Standing Expiration Date:   07/01/2023  .The total time spent in the appointment was 50 minutes* .  All of the patient's questions were answered with apparent satisfaction. The patient knows to call the clinic with any problems, questions or concerns.   Wyvonnia Lora MD MS AAHIVMS [facility] Hematology/Oncology Physician [facility]  .*Total Encounter Time as defined by the Centers for Medicare and Medicaid Services includes, in addition to the face-to-face time of a  patient visit (documented in the note above) non-face-to-face time: obtaining and reviewing outside history, ordering and reviewing medications, tests or procedures, care coordination (communications with other health care professionals or caregivers) and documentation in the medical record.  07/01/2022 10:01 AM

## 2022-07-04 ENCOUNTER — Other Ambulatory Visit: Payer: Self-pay

## 2022-07-04 ENCOUNTER — Other Ambulatory Visit (HOSPITAL_COMMUNITY): Payer: Self-pay

## 2022-07-04 MED ORDER — TRAMADOL HCL 50 MG PO TABS
50.0000 mg | ORAL_TABLET | ORAL | 0 refills | Status: AC | PRN
  Filled 2022-07-04 (×2): qty 30, 5d supply, fill #0

## 2022-07-05 ENCOUNTER — Encounter: Payer: Self-pay | Admitting: Internal Medicine

## 2022-07-07 ENCOUNTER — Telehealth: Payer: Self-pay | Admitting: Hematology

## 2022-07-08 ENCOUNTER — Other Ambulatory Visit (HOSPITAL_COMMUNITY): Payer: Self-pay

## 2022-07-08 ENCOUNTER — Encounter: Payer: Self-pay | Admitting: Hematology

## 2022-07-08 MED ORDER — B-12 1000 MCG SL SUBL: 1000.0000 ug | SUBLINGUAL_TABLET | Freq: Every day | SUBLINGUAL | 3 refills | Status: AC

## 2022-07-08 MED ORDER — ERGOCALCIFEROL 1.25 MG (50000 UT) PO CAPS
50000.0000 [IU] | ORAL_CAPSULE | ORAL | 0 refills | Status: AC
  Filled 2022-07-08: qty 12, 84d supply, fill #0

## 2022-07-08 NOTE — Addendum Note (Signed)
Addended by: Wyvonnia Lora on: 07/08/2022 07:35 AM   Modules accepted: Orders

## 2022-07-10 ENCOUNTER — Other Ambulatory Visit: Payer: Self-pay

## 2022-07-10 ENCOUNTER — Inpatient Hospital Stay: Payer: 59

## 2022-07-10 VITALS — BP 143/84 | HR 67 | Temp 98.8°F | Resp 14

## 2022-07-10 DIAGNOSIS — Z79899 Other long term (current) drug therapy: Secondary | ICD-10-CM | POA: Diagnosis not present

## 2022-07-10 DIAGNOSIS — D509 Iron deficiency anemia, unspecified: Secondary | ICD-10-CM

## 2022-07-10 DIAGNOSIS — R5383 Other fatigue: Secondary | ICD-10-CM | POA: Diagnosis not present

## 2022-07-10 DIAGNOSIS — K219 Gastro-esophageal reflux disease without esophagitis: Secondary | ICD-10-CM | POA: Diagnosis not present

## 2022-07-10 DIAGNOSIS — K449 Diaphragmatic hernia without obstruction or gangrene: Secondary | ICD-10-CM | POA: Diagnosis not present

## 2022-07-10 DIAGNOSIS — Z9884 Bariatric surgery status: Secondary | ICD-10-CM | POA: Diagnosis not present

## 2022-07-10 MED ORDER — LORATADINE 10 MG PO TABS
10.0000 mg | ORAL_TABLET | Freq: Once | ORAL | Status: AC
  Administered 2022-07-10: 10 mg via ORAL
  Filled 2022-07-10: qty 1

## 2022-07-10 MED ORDER — ACETAMINOPHEN 325 MG PO TABS
650.0000 mg | ORAL_TABLET | Freq: Once | ORAL | Status: AC
  Administered 2022-07-10: 650 mg via ORAL
  Filled 2022-07-10: qty 2

## 2022-07-10 MED ORDER — SODIUM CHLORIDE 0.9 % IV SOLN
300.0000 mg | Freq: Once | INTRAVENOUS | Status: AC
  Administered 2022-07-10: 300 mg via INTRAVENOUS
  Filled 2022-07-10: qty 300

## 2022-07-10 MED ORDER — SODIUM CHLORIDE 0.9 % IV SOLN: Freq: Once | INTRAVENOUS | Status: AC

## 2022-07-10 NOTE — Progress Notes (Signed)
Pt observed for 30 minutes post Venofer infusion. Pt tolerated trtmt well w/out incident. VSS at discharge.  Ambulatory to lobby.   

## 2022-07-10 NOTE — Patient Instructions (Signed)

## 2022-07-11 ENCOUNTER — Telehealth: Payer: Self-pay | Admitting: Internal Medicine

## 2022-07-11 NOTE — Telephone Encounter (Signed)
Inbound call from patient stating she received a iron infusion yesterday 6/10. She is scheduled for colonoscopy 6/17. Starts 5 day restricted diet prep today 6/11. Patient is requesting a call back to make sure it is still okay to proceed with colonoscopy. Please advise, thank you.

## 2022-07-11 NOTE — Telephone Encounter (Signed)
Spoke with patient. Iron restrictions to start 07/12/22. Made patient aware it is ok for her to proceed as schedule.

## 2022-07-14 ENCOUNTER — Other Ambulatory Visit (HOSPITAL_COMMUNITY): Payer: Self-pay

## 2022-07-14 ENCOUNTER — Other Ambulatory Visit: Payer: Self-pay

## 2022-07-14 MED ORDER — PREGABALIN 75 MG PO CAPS
75.0000 mg | ORAL_CAPSULE | Freq: Two times a day (BID) | ORAL | 0 refills | Status: DC
  Filled 2022-07-14: qty 60, 30d supply, fill #0

## 2022-07-17 ENCOUNTER — Ambulatory Visit (AMBULATORY_SURGERY_CENTER): Payer: 59 | Admitting: Internal Medicine

## 2022-07-17 ENCOUNTER — Encounter: Payer: Self-pay | Admitting: Internal Medicine

## 2022-07-17 VITALS — BP 128/79 | HR 76 | Temp 98.7°F | Resp 10 | Ht 62.0 in | Wt 160.0 lb

## 2022-07-17 DIAGNOSIS — D123 Benign neoplasm of transverse colon: Secondary | ICD-10-CM

## 2022-07-17 DIAGNOSIS — Z800 Family history of malignant neoplasm of digestive organs: Secondary | ICD-10-CM | POA: Diagnosis not present

## 2022-07-17 DIAGNOSIS — Z1211 Encounter for screening for malignant neoplasm of colon: Secondary | ICD-10-CM

## 2022-07-17 MED ORDER — SODIUM CHLORIDE 0.9 % IV SOLN
500.0000 mL | Freq: Once | INTRAVENOUS | Status: DC
Start: 2022-07-17 — End: 2022-07-17

## 2022-07-17 NOTE — Progress Notes (Signed)
Called to room to assist during endoscopic procedure.  Patient ID and intended procedure confirmed with present staff. Received instructions for my participation in the procedure from the performing physician.  

## 2022-07-17 NOTE — Op Note (Signed)
[facility] Patient Name: Heidi Jordan Procedure Date: 07/17/2022 10:03 AM MRN: [id number] Endoscopist: Beverley Fiedler , MD, [id number] Age: 57 Referring MD:  Date of Birth: 01-21-1966 Gender: Female Account #: [id number] Procedure:                Colonoscopy Indications:              Screening in patient at increased risk: Family                            history of 1st-degree relative with colorectal                            cancer (mother), Last colonoscopy: June 2019 Medicines:                Monitored Anesthesia Care Procedure:                Pre-Anesthesia Assessment:                           - Prior to the procedure, a History and Physical                            was performed, and patient medications and                            allergies were reviewed. The patient's tolerance of                            previous anesthesia was also reviewed. The risks                            and benefits of the procedure and the sedation                            options and risks were discussed with the patient.                            All questions were answered, and informed consent                            was obtained. Prior Anticoagulants: The patient has                            taken no anticoagulant or antiplatelet agents. ASA                            Grade Assessment: II - A patient with mild systemic                            disease. After reviewing the risks and benefits,                            the patient was deemed in satisfactory condition to  undergo the procedure.                           After obtaining informed consent, the colonoscope                            was passed under direct vision. Throughout the                            procedure, the patient's blood pressure, pulse, and                            oxygen saturations were monitored continuously. The                            [id number]-[id number]  Colonoscope [id number] was introduced                            through the anus and advanced to the terminal                            ileum. The colonoscopy was performed without                            difficulty. The patient tolerated the procedure                            well. The quality of the bowel preparation was                            excellent. The terminal ileum, ileocecal valve,                            appendiceal orifice, and rectum were photographed. Scope In: 10:19:05 AM Scope Out: 10:35:05 AM Scope Withdrawal Time: 0 hours 12 minutes 32 seconds  Total Procedure Duration: 0 hours 16 minutes 0 seconds  Findings:                 The digital rectal exam was normal.                           The terminal ileum appeared normal.                           A 3 mm polyp was found in the transverse colon. The                            polyp was sessile. The polyp was removed with a                            cold snare. Resection and retrieval were complete.                           Internal hemorrhoids were found during  retroflexion. The hemorrhoids were small.                           The exam was otherwise without abnormality. Complications:            No immediate complications. Estimated Blood Loss:     Estimated blood loss: none. Impression:               - The examined portion of the ileum was normal.                           - One 3 mm polyp in the transverse colon, removed                            with a cold snare. Resected and retrieved.                           - Small internal hemorrhoids.                           - The examination was otherwise normal. Recommendation:           - Patient has a contact number available for                            emergencies. The signs and symptoms of potential                            delayed complications were discussed with the                            patient. Return to normal  activities tomorrow.                            Written discharge instructions were provided to the                            patient.                           - Resume previous diet.                           - Continue present medications.                           - Await pathology results.                           - Recurrent IDA, EGD/colon in 2019 for the same,                            though at that time heavy menses was felt to                            explain (now s/p ablation with no further  menstruation).                           - Iron replacement per hematology.                           - Would recommend VCE (which can be scheduled if                            insurance approves).                           - Proceed with MRI abd with and without contrast to                            eval liver lesion seen on non-contrasted MR of                            spine.                           - Repeat colonoscopy is recommended. The                            colonoscopy date will be determined after pathology                            results from today's exam become available for                            review. Beverley Fiedler, MD 07/17/2022 10:40:50 AM This report has been signed electronically.

## 2022-07-17 NOTE — Progress Notes (Signed)
Pt's states no medical or surgical changes since previsit or office visit. 

## 2022-07-17 NOTE — Patient Instructions (Signed)
Thank you for coming in to see [place] today. Resume previous diet and daily medications today. Return to regular daily activities tomorrow. Await pathology, 1-2 weeks. Surveillance colonoscopy will be recommended at that time. Iron replacement per hematology. Proceed with MRI.     YOU HAD AN ENDOSCOPIC PROCEDURE TODAY AT THE [facility]:   Refer to the procedure report that was given to you for any specific questions about what was found during the examination.  If the procedure report does not answer your questions, please call your gastroenterologist to clarify.  If you requested that your care partner not be given the details of your procedure findings, then the procedure report has been included in a sealed envelope for you to review at your convenience later.  YOU SHOULD EXPECT: Some feelings of bloating in the abdomen. Passage of more gas than usual.  Walking can help get rid of the air that was put into your GI tract during the procedure and reduce the bloating. If you had a lower endoscopy (such as a colonoscopy or flexible sigmoidoscopy) you may notice spotting of blood in your stool or on the toilet paper. If you underwent a bowel prep for your procedure, you may not have a normal bowel movement for a few days.  Please Note:  You might notice some irritation and congestion in your nose or some drainage.  This is from the oxygen used during your procedure.  There is no need for concern and it should clear up in a day or so.  SYMPTOMS TO REPORT IMMEDIATELY:  Following lower endoscopy (colonoscopy or flexible sigmoidoscopy):  Excessive amounts of blood in the stool  Significant tenderness or worsening of abdominal pains  Swelling of the abdomen that is new, acute  Fever of 100F or higher    For urgent or emergent issues, a gastroenterologist can be reached at any hour by calling [phone]. Do not use MyChart messaging for urgent concerns.    DIET:  We do recommend  a small meal at first, but then you may proceed to your regular diet.  Drink plenty of fluids but you should avoid alcoholic beverages for 24 hours.  ACTIVITY:  You should plan to take it easy for the rest of today and you should NOT DRIVE or use heavy machinery until tomorrow (because of the sedation medicines used during the test).    FOLLOW UP: Our staff will call the number listed on your records the next business day following your procedure.  We will call around 7:15- 8:00 am to check on you and address any questions or concerns that you may have regarding the information given to you following your procedure. If we do not reach you, we will leave a message.     If any biopsies were taken you will be contacted by phone or by letter within the next 1-3 weeks.  Please call us at [phone] if you have not heard about the biopsies in 3 weeks.    SIGNATURES/CONFIDENTIALITY: You and/or your care partner have signed paperwork which will be entered into your electronic medical record.  These signatures attest to the fact that that the information above on your After Visit Summary has been reviewed and is understood.  Full responsibility of the confidentiality of this discharge information lies with you and/or your care-partner.

## 2022-07-17 NOTE — Progress Notes (Signed)
GASTROENTEROLOGY PROCEDURE H&P NOTE   Primary Care Physician: Tally Joe, MD    Reason for Procedure:  Family history of colon cancer  Plan:    Colonoscopy  Patient is appropriate for endoscopic procedure(s) in the ambulatory ([facility]) setting.  The nature of the procedure, as well as the risks, benefits, and alternatives were carefully and thoroughly reviewed with the patient. Ample time for discussion and questions allowed. The patient understood, was satisfied, and agreed to proceed.     HPI: Heidi Jordan is a 57 y.o. female who presents for colonoscopy.  Medical history as below.  Tolerated the prep.  No recent chest pain or shortness of breath.  No abdominal pain today.  Past Medical History:  Diagnosis Date   Anemia    Chronic back pain    tx with ibuprofen   Chronic tension headaches    External hemorrhoids    GERD (gastroesophageal reflux disease)    Hiatal hernia    Insomnia    tx with amitriptyline   Internal hemorrhoids    Iron deficiency    Laryngopharyngeal reflux    Missed abortion    no surgery reuqired   Obesity     Past Surgical History:  Procedure Laterality Date   BREAST REDUCTION SURGERY Bilateral 11/17/2019   Procedure: BREAST REDUCTION WITH LIPOSUCTION;  Surgeon: Peggye Form, DO;  Location: [facility];  Service: Plastics;  Laterality: Bilateral;   CESAREAN SECTION     x 2   COLONOSCOPY     DIAGNOSTIC LAPAROSCOPY     x 2 surg,for ectopic preg w/ removal of tube   DIAGNOSTIC LAPAROSCOPY     endometriosis - removed tube   fibroid tumor removal     HYSTEROSCOPY WITH THERMACHOICE  06/14/2011   Procedure: HYSTEROSCOPY WITH THERMACHOICE;  Surgeon: Geryl Rankins, MD;  Location: [facility];  Service: Gynecology;;  Donnie Aho choice started at 1413   Lap Band surgery  2004   LIPOSUCTION N/A 01/07/2020   Procedure: LIPOSUCTION;  Surgeon: Peggye Form, DO;  Location: [facility];  Service: Plastics;   Laterality: N/A;   PANNICULECTOMY N/A 01/07/2020   Procedure: PANNICULECTOMY;  Surgeon: Peggye Form, DO;  Location: [facility];  Service: Plastics;  Laterality: N/A;  3 hours, please   UPPER GASTROINTESTINAL ENDOSCOPY     WISDOM TOOTH EXTRACTION      Prior to Admission medications   Medication Sig Start Date End Date Taking? Authorizing Provider  amitriptyline (ELAVIL) 50 MG tablet Take 1 tablet by mouth at bedtime. 03/07/21  Yes   omeprazole (PRILOSEC) 20 MG capsule Take 2 capsules by mouth daily. 01/20/21  Yes   pregabalin (LYRICA) 75 MG capsule Take 1 capsule (75 mg total) by mouth 2 (two) times daily. 07/13/22  Yes   amitriptyline (ELAVIL) 50 MG tablet Take 50 mg by mouth at bedtime.    [provider]  amitriptyline (ELAVIL) 50 MG tablet TAKE 1 TABLET BY MOUTH AT BEDTIME 09/30/19 09/29/20  Tally Joe, MD  amitriptyline (ELAVIL) 50 MG tablet Take 1 tablet (50 mg total) by mouth once a day at bedtime as needed. 04/29/21     amitriptyline (ELAVIL) 50 MG tablet Take 1 tablet (50 mg total) by mouth daily. 06/05/22     Cyanocobalamin (B-12) 1000 MCG SUBL Place 1,000 mcg under the tongue daily. 07/08/22   Johney Maine, MD  ergocalciferol (VITAMIN D2) 1.25 MG (50000 UT) capsule Take 1 capsule (50,000 Units total) by mouth  once a week. 07/08/22   Johney Maine, MD  omeprazole (PRILOSEC) 20 MG capsule TAKE 2 CAPSULES BY MOUTH DAILY 09/30/19 09/29/20  Tally Joe, MD  omeprazole (PRILOSEC) 20 MG capsule Take 2 capsules (40 mg total) by mouth daily. 06/05/22     omeprazole (PRILOSEC) 40 MG capsule Take 40 mg by mouth daily.    [provider]  pregabalin (LYRICA) 50 MG capsule Take 1 capsule (50 mg total) by mouth 2 (two) times daily. 06/08/22     Semaglutide-Weight Management (WEGOVY) 0.25 MG/0.5ML SOAJ Inject 0.5 mL into the skin once a week 05/17/21     Semaglutide-Weight Management (WEGOVY) 0.5 MG/0.5ML SOAJ Inject 0.5 mg into the skin once a week as  directed. 10/08/21     Semaglutide-Weight Management (WEGOVY) 1 MG/0.5ML SOAJ Inject 0.5 mL subcutaneously once a week as directed. 10/13/21     Semaglutide-Weight Management (WEGOVY) 1 MG/0.5ML SOAJ Inject 1 mg into the skin once a week. 12/12/21     Semaglutide-Weight Management (WEGOVY) 1 MG/0.5ML SOAJ Inject 1 mg into the skin every 7 (seven) days. 02/16/22     traMADol (ULTRAM) 50 MG tablet TAKE 1 TABLET BY MOUTH EVERY 6 HOURS AS NEEDED 08/20/19   Kerrin Champagne, MD  traMADol (ULTRAM) 50 MG tablet Take 1 tablet (50 mg total) by mouth every 4 (four) hours as needed. Patient not taking: Reported on 07/17/2022 08/06/20     traMADol (ULTRAM) 50 MG tablet Take 1 tablet by mouth every 4 hours as needed. (30 day supply per MD) Patient not taking: Reported on 07/17/2022 05/23/21     traMADol (ULTRAM) 50 MG tablet Take 1 tablet by mouth every 4 hours as needed (30 day supply) Patient not taking: Reported on 07/17/2022 07/29/21     traMADol (ULTRAM) 50 MG tablet Take 1 tablet (50 mg total) by mouth every 4 (four) hours as needed. Patient not taking: Reported on 07/17/2022 10/24/21     traMADol (ULTRAM) 50 MG tablet Take 1 tablet (50 mg total) by mouth every 4 (four) hours as needed. Patient not taking: Reported on 07/17/2022 07/03/22       Current Outpatient Medications  Medication Sig Dispense Refill   amitriptyline (ELAVIL) 50 MG tablet Take 1 tablet by mouth at bedtime. 90 tablet 0   omeprazole (PRILOSEC) 20 MG capsule Take 2 capsules by mouth daily. 180 capsule 0   pregabalin (LYRICA) 75 MG capsule Take 1 capsule (75 mg total) by mouth 2 (two) times daily. 60 capsule 0   amitriptyline (ELAVIL) 50 MG tablet Take 50 mg by mouth at bedtime.     amitriptyline (ELAVIL) 50 MG tablet TAKE 1 TABLET BY MOUTH AT BEDTIME 90 tablet 3   amitriptyline (ELAVIL) 50 MG tablet Take 1 tablet (50 mg total) by mouth once a day at bedtime as needed. 90 tablet 3   amitriptyline (ELAVIL) 50 MG tablet Take 1 tablet (50 mg total) by  mouth daily. 3 tablet 3   Cyanocobalamin (B-12) 1000 MCG SUBL Place 1,000 mcg under the tongue daily. 30 tablet 3   ergocalciferol (VITAMIN D2) 1.25 MG (50000 UT) capsule Take 1 capsule (50,000 Units total) by mouth once a week. 12 capsule 0   omeprazole (PRILOSEC) 20 MG capsule TAKE 2 CAPSULES BY MOUTH DAILY 180 capsule 3   omeprazole (PRILOSEC) 20 MG capsule Take 2 capsules (40 mg total) by mouth daily. 180 capsule 3   omeprazole (PRILOSEC) 40 MG capsule Take 40 mg by mouth daily.  pregabalin (LYRICA) 50 MG capsule Take 1 capsule (50 mg total) by mouth 2 (two) times daily. 60 capsule 0   Semaglutide-Weight Management (WEGOVY) 0.25 MG/0.5ML SOAJ Inject 0.5 mL into the skin once a week 2 mL 1   Semaglutide-Weight Management (WEGOVY) 0.5 MG/0.5ML SOAJ Inject 0.5 mg into the skin once a week as directed. 2 mL 0   Semaglutide-Weight Management (WEGOVY) 1 MG/0.5ML SOAJ Inject 0.5 mL subcutaneously once a week as directed. 2 mL 1   Semaglutide-Weight Management (WEGOVY) 1 MG/0.5ML SOAJ Inject 1 mg into the skin once a week. 2 mL 1   Semaglutide-Weight Management (WEGOVY) 1 MG/0.5ML SOAJ Inject 1 mg into the skin every 7 (seven) days. 2 mL 4   traMADol (ULTRAM) 50 MG tablet TAKE 1 TABLET BY MOUTH EVERY 6 HOURS AS NEEDED 10 tablet 0   traMADol (ULTRAM) 50 MG tablet Take 1 tablet (50 mg total) by mouth every 4 (four) hours as needed. (Patient not taking: Reported on 07/17/2022) 30 tablet 0   traMADol (ULTRAM) 50 MG tablet Take 1 tablet by mouth every 4 hours as needed. (30 day supply per MD) (Patient not taking: Reported on 07/17/2022) 30 tablet 0   traMADol (ULTRAM) 50 MG tablet Take 1 tablet by mouth every 4 hours as needed (30 day supply) (Patient not taking: Reported on 07/17/2022) 30 tablet 0   traMADol (ULTRAM) 50 MG tablet Take 1 tablet (50 mg total) by mouth every 4 (four) hours as needed. (Patient not taking: Reported on 07/17/2022) 30 tablet 0   traMADol (ULTRAM) 50 MG tablet Take 1 tablet (50 mg  total) by mouth every 4 (four) hours as needed. (Patient not taking: Reported on 07/17/2022) 30 tablet 0   Current Facility-Administered Medications  Medication Dose Route Frequency Provider Last Rate Last Admin   0.9 %  sodium chloride infusion  500 mL Intravenous Once Alayjah Boehringer, Carie Caddy, MD        Allergies as of 07/17/2022 - Review Complete 07/17/2022  Allergen Reaction Noted   Sulfa drugs cross reactors Other (See Comments) 05/25/2011    Family History  Problem Relation Age of Onset   Breast cancer Maternal Grandmother    Colon cancer Mother        in her 41s   Renal cancer Mother    Colon polyps Mother    Hypertension Father    Skin cancer Father    Lymphoma Father    Esophageal cancer Father    Stomach cancer Maternal Grandfather    Brain cancer Paternal Grandmother    Esophageal cancer Paternal Grandfather    Rectal cancer Neg Hx     Social History   Socioeconomic History   Marital status: Married    Spouse name: Not on file   Number of children: Not on file   Years of education: Not on file   Highest education level: Not on file  Occupational History   Not on file  Tobacco Use   Smoking status: Never   Smokeless tobacco: Never  Substance and Sexual Activity   Alcohol use: Yes    Comment: socially   Drug use: No   Sexual activity: Yes    Birth control/protection: None  Other Topics Concern   Not on file  Social History Narrative   Not on file   Social Determinants of Health   Financial Resource Strain: Not on file  Food Insecurity: Not on file  Transportation Needs: Not on file  Physical Activity: Not on file  Stress:  Not on file  Social Connections: Not on file  Intimate Partner Violence: Not on file    Physical Exam: Vital signs in last 24 hours: @BP  123/84   Pulse 81   Temp 98.7 F (37.1 C)   Ht 5\' 2"  (1.575 m)   Wt 160 lb (72.6 kg)   LMP 05/15/2011   SpO2 100%   BMI 29.26 kg/m  GEN: NAD EYE: Sclerae anicteric ENT: MMM CV:  Non-tachycardic Pulm: CTA b/l GI: Soft, NT/ND NEURO:  Alert & Oriented x 3   Erick Blinks, MD [facility]  07/17/2022 10:08 AM

## 2022-07-17 NOTE — Progress Notes (Signed)
Vss nad trans to pacu 

## 2022-07-18 ENCOUNTER — Telehealth: Payer: Self-pay

## 2022-07-18 ENCOUNTER — Other Ambulatory Visit: Payer: Self-pay | Admitting: *Deleted

## 2022-07-18 ENCOUNTER — Telehealth: Payer: Self-pay | Admitting: *Deleted

## 2022-07-18 DIAGNOSIS — R9389 Abnormal findings on diagnostic imaging of other specified body structures: Secondary | ICD-10-CM

## 2022-07-18 NOTE — Telephone Encounter (Signed)
  Follow up Call-     07/17/2022    9:16 AM  Call back number  Post procedure Call Back phone  # [phone]  Permission to leave phone message Yes     Patient questions:  Do you have a fever, pain , or abdominal swelling? No. Pain Score  0 *  Have you tolerated food without any problems? Yes.    Have you been able to return to your normal activities? Yes.    Do you have any questions about your discharge instructions: Diet   No. Medications  No. Follow up visit  No.  Do you have questions or concerns about your Care? No.  Actions: * If pain score is 4 or above: No action needed, pain <4.

## 2022-07-18 NOTE — Telephone Encounter (Signed)
Per colonoscopy report 07/17/22, Dr Rhea Belton recommended patient to have an MRI abdomen w/wo contrast to further evaluate a liver lesion seen on a recent non-contrasted MR-spine. Unfortunately, the patient had iron infusion 07/10/22 and is scheduled for subsequent infusions as well. Radiology prefers not to schedule MRI abdomen within 3 months of iron infusion due to image degradation caused by the iron. I have spoken with Dr Rhea Belton about this and he states due to this, he would go forward with RUQ abdominal ultrasound instead. This has been scheduled at [facility] on 08/21/22 at 7 am (earlier date was offered, but patient requested week of 7/22). Patient verbalizes understanding of time/date/location/prep for the ultrasound.  In addition, Dr Rhea Belton recommended video capsule endoscopy for further evaluation of recurrent iron deficiency anemia if insurance would cover. I have advised patient of this information. She is concerned because she has recently taken several days off of work for other medical appointments, therefore, she would like to look over her work schedule and call us back with a date that she can come in for this test. Will await a return call for this.

## 2022-07-19 ENCOUNTER — Inpatient Hospital Stay: Payer: 59

## 2022-07-19 ENCOUNTER — Other Ambulatory Visit: Payer: Self-pay

## 2022-07-19 VITALS — BP 128/97 | HR 65 | Temp 98.5°F | Resp 16

## 2022-07-19 DIAGNOSIS — Z9884 Bariatric surgery status: Secondary | ICD-10-CM | POA: Diagnosis not present

## 2022-07-19 DIAGNOSIS — K449 Diaphragmatic hernia without obstruction or gangrene: Secondary | ICD-10-CM | POA: Diagnosis not present

## 2022-07-19 DIAGNOSIS — D509 Iron deficiency anemia, unspecified: Secondary | ICD-10-CM

## 2022-07-19 DIAGNOSIS — K219 Gastro-esophageal reflux disease without esophagitis: Secondary | ICD-10-CM | POA: Diagnosis not present

## 2022-07-19 DIAGNOSIS — R5383 Other fatigue: Secondary | ICD-10-CM | POA: Diagnosis not present

## 2022-07-19 DIAGNOSIS — Z79899 Other long term (current) drug therapy: Secondary | ICD-10-CM | POA: Diagnosis not present

## 2022-07-19 MED ORDER — SODIUM CHLORIDE 0.9 % IV SOLN: Freq: Once | INTRAVENOUS | Status: AC

## 2022-07-19 MED ORDER — LORATADINE 10 MG PO TABS
10.0000 mg | ORAL_TABLET | Freq: Once | ORAL | Status: AC
  Administered 2022-07-19: 10 mg via ORAL
  Filled 2022-07-19: qty 1

## 2022-07-19 MED ORDER — ACETAMINOPHEN 325 MG PO TABS
650.0000 mg | ORAL_TABLET | Freq: Once | ORAL | Status: AC
  Administered 2022-07-19: 650 mg via ORAL
  Filled 2022-07-19: qty 2

## 2022-07-19 MED ORDER — SODIUM CHLORIDE 0.9 % IV SOLN
300.0000 mg | Freq: Once | INTRAVENOUS | Status: AC
  Administered 2022-07-19: 300 mg via INTRAVENOUS
  Filled 2022-07-19: qty 300

## 2022-07-19 NOTE — Patient Instructions (Signed)

## 2022-07-19 NOTE — Telephone Encounter (Signed)
Dr Rhea Belton-  Patient called back today regarding follow up imaging for the liver lesion seen on recent MR-spine. We originally wanted MRI abdomen but changed to RUQ ultrasound due to patient being on iron infusions and the 3 month delay for MR imaging required with that. Patient is scheduled for another iron infusion on 07/26/22 but says this is actually slated to be her last infusion. With that information, would you like to just wait and have MRI done 3 months from the 07/26/22 date or would you like her to move forward with u/s prior to the 3 month mark for sooner evaluation?

## 2022-07-19 NOTE — Progress Notes (Signed)
Pt tolerated IV iron infusion well. Declined to stay for 30 minute post observation period. VSS, ambulatory to the lobby.

## 2022-07-19 NOTE — Telephone Encounter (Signed)
Inbound call from patient requesting a call back

## 2022-07-19 NOTE — Telephone Encounter (Signed)
Ok to wait on the MRI?

## 2022-07-19 NOTE — Telephone Encounter (Signed)
Requesting a call back regarding previous notes and MRI. States can give a call back on mobile and work phone number. Please advise, thank you.

## 2022-07-20 NOTE — Addendum Note (Signed)
Addended by: Richardson Chiquito on: 07/20/2022 02:13 PM   Modules accepted: Orders

## 2022-07-20 NOTE — Telephone Encounter (Signed)
I have spoken to patient to advise that Dr Rhea Belton is okay with waiting for MRI imaging of the liver lesion on a date after 10/26/22 with the understanding that she is not having any additional iron infusions past 07/26/22 which would delay MR imaging. U/S CANCELLED. MR abdomen w/wo order has been placed again. Patient states she would like to call and schedule this appointment on a day that would work well for her.

## 2022-07-24 ENCOUNTER — Encounter: Payer: Self-pay | Admitting: Internal Medicine

## 2022-07-26 ENCOUNTER — Other Ambulatory Visit: Payer: Self-pay

## 2022-07-26 ENCOUNTER — Ambulatory Visit (HOSPITAL_COMMUNITY): Payer: 59

## 2022-07-26 ENCOUNTER — Inpatient Hospital Stay: Payer: 59

## 2022-07-26 ENCOUNTER — Ambulatory Visit: Payer: 59

## 2022-07-26 VITALS — BP 132/92 | HR 82 | Temp 97.8°F | Resp 18

## 2022-07-26 DIAGNOSIS — D509 Iron deficiency anemia, unspecified: Secondary | ICD-10-CM

## 2022-07-26 DIAGNOSIS — Z79899 Other long term (current) drug therapy: Secondary | ICD-10-CM | POA: Diagnosis not present

## 2022-07-26 DIAGNOSIS — R5383 Other fatigue: Secondary | ICD-10-CM | POA: Diagnosis not present

## 2022-07-26 DIAGNOSIS — K449 Diaphragmatic hernia without obstruction or gangrene: Secondary | ICD-10-CM | POA: Diagnosis not present

## 2022-07-26 DIAGNOSIS — Z9884 Bariatric surgery status: Secondary | ICD-10-CM | POA: Diagnosis not present

## 2022-07-26 DIAGNOSIS — K219 Gastro-esophageal reflux disease without esophagitis: Secondary | ICD-10-CM | POA: Diagnosis not present

## 2022-07-26 MED ORDER — ACETAMINOPHEN 325 MG PO TABS
650.0000 mg | ORAL_TABLET | Freq: Once | ORAL | Status: AC
  Administered 2022-07-26: 650 mg via ORAL
  Filled 2022-07-26: qty 2

## 2022-07-26 MED ORDER — LORATADINE 10 MG PO TABS
10.0000 mg | ORAL_TABLET | Freq: Once | ORAL | Status: AC
  Administered 2022-07-26: 10 mg via ORAL
  Filled 2022-07-26: qty 1

## 2022-07-26 MED ORDER — SODIUM CHLORIDE 0.9 % IV SOLN
300.0000 mg | Freq: Once | INTRAVENOUS | Status: AC
  Administered 2022-07-26: 300 mg via INTRAVENOUS
  Filled 2022-07-26: qty 300

## 2022-07-26 MED ORDER — SODIUM CHLORIDE 0.9 % IV SOLN: Freq: Once | INTRAVENOUS | Status: AC

## 2022-07-26 NOTE — Patient Instructions (Signed)

## 2022-08-01 ENCOUNTER — Other Ambulatory Visit (HOSPITAL_COMMUNITY): Payer: Self-pay

## 2022-08-01 ENCOUNTER — Other Ambulatory Visit: Payer: Self-pay

## 2022-08-04 ENCOUNTER — Telehealth: Payer: Self-pay

## 2022-08-04 ENCOUNTER — Other Ambulatory Visit (HOSPITAL_COMMUNITY): Payer: Self-pay

## 2022-08-04 NOTE — Telephone Encounter (Signed)
Contacted pt per Dr Candise Che: to let patient know she has some B12 deficiency and also severe vit D deficiency. Start on B12 1000 mcg SL daily and I have send prescription for ergocalciferol 50k units week for her vit D deficiency.  Pt acknowledged information and verbalized understanding.

## 2022-08-07 ENCOUNTER — Other Ambulatory Visit (HOSPITAL_COMMUNITY): Payer: Self-pay

## 2022-08-08 ENCOUNTER — Other Ambulatory Visit (HOSPITAL_COMMUNITY): Payer: Self-pay

## 2022-08-08 MED ORDER — AMITRIPTYLINE HCL 50 MG PO TABS
50.0000 mg | ORAL_TABLET | Freq: Every day | ORAL | 3 refills | Status: DC
  Filled 2022-08-08: qty 90, 90d supply, fill #0
  Filled 2022-09-13 – 2022-10-24 (×2): qty 90, 90d supply, fill #1
  Filled 2023-01-24: qty 90, 90d supply, fill #2
  Filled 2023-04-24: qty 90, 90d supply, fill #3

## 2022-08-15 ENCOUNTER — Other Ambulatory Visit (HOSPITAL_COMMUNITY): Payer: Self-pay

## 2022-08-16 ENCOUNTER — Other Ambulatory Visit (HOSPITAL_COMMUNITY): Payer: Self-pay

## 2022-08-17 ENCOUNTER — Other Ambulatory Visit (HOSPITAL_COMMUNITY): Payer: Self-pay

## 2022-08-17 MED ORDER — PREGABALIN 75 MG PO CAPS
75.0000 mg | ORAL_CAPSULE | Freq: Two times a day (BID) | ORAL | 0 refills | Status: AC
  Filled 2022-08-17: qty 60, 30d supply, fill #0

## 2022-08-18 ENCOUNTER — Other Ambulatory Visit (HOSPITAL_COMMUNITY): Payer: Self-pay

## 2022-08-18 MED ORDER — PREGABALIN 75 MG PO CAPS
75.0000 mg | ORAL_CAPSULE | Freq: Two times a day (BID) | ORAL | 0 refills | Status: DC
  Filled 2022-08-18 – 2022-09-25 (×3): qty 60, 30d supply, fill #0

## 2022-08-21 ENCOUNTER — Other Ambulatory Visit (HOSPITAL_COMMUNITY): Payer: 59

## 2022-08-22 ENCOUNTER — Telehealth: Payer: Self-pay | Admitting: Internal Medicine

## 2022-08-22 NOTE — Telephone Encounter (Signed)
Inbound call from patient returning phone from 7/19 from [place]. States she was left a voicemail to have to MRI completed before follow up appointment on 8/7. Please advise, thank you.

## 2022-08-22 NOTE — Telephone Encounter (Signed)
Explained to patient she has an appt with Dr. Rhea Belton on 09/06/22 and we need to reschedule this appt until after she has the MRI of her liver. Patient verbalized understanding. Patient also states she has not heard from the radiology department to schedule her MRI. Informed patent that the order has been placed in our system so she can reach out the radiology department to schedule at her convenience. Phone number has been provided to patient to call the radiology department. Reminded patient to schedule the MRI after 10/26/22. Patient verbalized understanding.

## 2022-08-23 ENCOUNTER — Other Ambulatory Visit (HOSPITAL_COMMUNITY): Payer: Self-pay

## 2022-09-06 ENCOUNTER — Ambulatory Visit: Payer: 59 | Admitting: Internal Medicine

## 2022-09-13 ENCOUNTER — Other Ambulatory Visit (HOSPITAL_COMMUNITY): Payer: Self-pay

## 2022-09-13 ENCOUNTER — Encounter: Payer: Self-pay | Admitting: Hematology

## 2022-09-14 ENCOUNTER — Other Ambulatory Visit (HOSPITAL_COMMUNITY): Payer: Self-pay

## 2022-09-25 ENCOUNTER — Other Ambulatory Visit (HOSPITAL_COMMUNITY): Payer: Self-pay

## 2022-09-26 ENCOUNTER — Other Ambulatory Visit: Payer: Self-pay

## 2022-09-27 ENCOUNTER — Other Ambulatory Visit (HOSPITAL_COMMUNITY): Payer: Self-pay

## 2022-09-30 ENCOUNTER — Other Ambulatory Visit (HOSPITAL_COMMUNITY): Payer: Self-pay

## 2022-10-03 ENCOUNTER — Other Ambulatory Visit: Payer: Self-pay

## 2022-10-03 ENCOUNTER — Telehealth: Payer: Self-pay | Admitting: Hematology

## 2022-10-03 DIAGNOSIS — D509 Iron deficiency anemia, unspecified: Secondary | ICD-10-CM

## 2022-10-03 NOTE — Telephone Encounter (Signed)
Patient called in to cancel 09/04 appointments. Patient will call back to reschedule.

## 2022-10-04 ENCOUNTER — Inpatient Hospital Stay: Payer: 59

## 2022-10-04 ENCOUNTER — Inpatient Hospital Stay: Payer: 59 | Admitting: Hematology

## 2022-10-19 ENCOUNTER — Other Ambulatory Visit (HOSPITAL_COMMUNITY): Payer: Self-pay

## 2022-10-19 MED ORDER — PREGABALIN 75 MG PO CAPS
75.0000 mg | ORAL_CAPSULE | Freq: Two times a day (BID) | ORAL | 0 refills | Status: AC
  Filled 2022-10-19 – 2022-10-24 (×2): qty 180, 90d supply, fill #0

## 2022-10-24 ENCOUNTER — Other Ambulatory Visit (HOSPITAL_COMMUNITY): Payer: Self-pay

## 2022-10-24 ENCOUNTER — Other Ambulatory Visit: Payer: Self-pay

## 2022-11-23 ENCOUNTER — Other Ambulatory Visit: Payer: Self-pay

## 2022-11-23 DIAGNOSIS — R9389 Abnormal findings on diagnostic imaging of other specified body structures: Secondary | ICD-10-CM

## 2022-11-30 ENCOUNTER — Ambulatory Visit (HOSPITAL_COMMUNITY)
Admission: RE | Admit: 2022-11-30 | Discharge: 2022-11-30 | Disposition: A | Discharge status: Home / Self Care | Payer: 59 | Source: Ambulatory Visit | Attending: Internal Medicine | Admitting: Internal Medicine

## 2022-11-30 DIAGNOSIS — R9389 Abnormal findings on diagnostic imaging of other specified body structures: Secondary | ICD-10-CM | POA: Insufficient documentation

## 2022-11-30 DIAGNOSIS — K7689 Other specified diseases of liver: Secondary | ICD-10-CM | POA: Diagnosis not present

## 2022-11-30 DIAGNOSIS — K8020 Calculus of gallbladder without cholecystitis without obstruction: Secondary | ICD-10-CM | POA: Diagnosis not present

## 2022-11-30 MED ORDER — GADOBUTROL 1 MMOL/ML IV SOLN
7.0000 mL | Freq: Once | INTRAVENOUS | Status: AC | PRN
  Administered 2022-11-30: 7 mL via INTRAVENOUS

## 2022-12-08 ENCOUNTER — Ambulatory Visit: Payer: 59 | Admitting: Internal Medicine

## 2023-01-04 ENCOUNTER — Other Ambulatory Visit (HOSPITAL_COMMUNITY): Payer: Self-pay

## 2023-01-19 DIAGNOSIS — Z01419 Encounter for gynecological examination (general) (routine) without abnormal findings: Secondary | ICD-10-CM | POA: Diagnosis not present

## 2023-01-19 DIAGNOSIS — R6882 Decreased libido: Secondary | ICD-10-CM | POA: Diagnosis not present

## 2023-01-19 DIAGNOSIS — Z6832 Body mass index (BMI) 32.0-32.9, adult: Secondary | ICD-10-CM | POA: Diagnosis not present

## 2023-01-19 DIAGNOSIS — Z124 Encounter for screening for malignant neoplasm of cervix: Secondary | ICD-10-CM | POA: Diagnosis not present

## 2023-01-19 DIAGNOSIS — N952 Postmenopausal atrophic vaginitis: Secondary | ICD-10-CM | POA: Diagnosis not present

## 2023-01-24 ENCOUNTER — Other Ambulatory Visit (HOSPITAL_COMMUNITY): Payer: Self-pay

## 2023-01-25 ENCOUNTER — Other Ambulatory Visit (HOSPITAL_COMMUNITY): Payer: Self-pay

## 2023-02-19 DIAGNOSIS — Z1231 Encounter for screening mammogram for malignant neoplasm of breast: Secondary | ICD-10-CM | POA: Diagnosis not present

## 2023-03-08 ENCOUNTER — Ambulatory Visit: Payer: 59 | Admitting: Internal Medicine

## 2023-04-24 ENCOUNTER — Encounter: Payer: Self-pay | Admitting: Hematology

## 2023-04-24 ENCOUNTER — Other Ambulatory Visit (HOSPITAL_COMMUNITY): Payer: Self-pay

## 2023-05-21 ENCOUNTER — Other Ambulatory Visit (HOSPITAL_COMMUNITY): Payer: Self-pay

## 2023-05-23 ENCOUNTER — Other Ambulatory Visit (HOSPITAL_COMMUNITY): Payer: Self-pay

## 2023-05-23 ENCOUNTER — Other Ambulatory Visit: Payer: Self-pay

## 2023-05-23 MED ORDER — TRAMADOL HCL 50 MG PO TABS
50.0000 mg | ORAL_TABLET | ORAL | 0 refills | Status: AC | PRN
  Filled 2023-05-23: qty 30, 5d supply, fill #0

## 2023-06-11 ENCOUNTER — Other Ambulatory Visit (HOSPITAL_COMMUNITY): Payer: Self-pay

## 2023-06-14 DIAGNOSIS — K7689 Other specified diseases of liver: Secondary | ICD-10-CM | POA: Diagnosis not present

## 2023-06-14 DIAGNOSIS — Z6831 Body mass index (BMI) 31.0-31.9, adult: Secondary | ICD-10-CM | POA: Diagnosis not present

## 2023-06-14 DIAGNOSIS — Z0000 Encounter for general adult medical examination without abnormal findings: Secondary | ICD-10-CM | POA: Diagnosis not present

## 2023-06-14 DIAGNOSIS — R030 Elevated blood-pressure reading, without diagnosis of hypertension: Secondary | ICD-10-CM | POA: Diagnosis not present

## 2023-06-14 DIAGNOSIS — Z1211 Encounter for screening for malignant neoplasm of colon: Secondary | ICD-10-CM | POA: Diagnosis not present

## 2023-06-14 DIAGNOSIS — D509 Iron deficiency anemia, unspecified: Secondary | ICD-10-CM | POA: Diagnosis not present

## 2023-06-14 DIAGNOSIS — M546 Pain in thoracic spine: Secondary | ICD-10-CM | POA: Diagnosis not present

## 2023-06-14 DIAGNOSIS — K219 Gastro-esophageal reflux disease without esophagitis: Secondary | ICD-10-CM | POA: Diagnosis not present

## 2023-06-14 DIAGNOSIS — G4700 Insomnia, unspecified: Secondary | ICD-10-CM | POA: Diagnosis not present

## 2023-06-14 DIAGNOSIS — E7800 Pure hypercholesterolemia, unspecified: Secondary | ICD-10-CM | POA: Diagnosis not present

## 2023-06-15 ENCOUNTER — Other Ambulatory Visit (HOSPITAL_COMMUNITY): Payer: Self-pay

## 2023-06-15 MED ORDER — AMITRIPTYLINE HCL 50 MG PO TABS
50.0000 mg | ORAL_TABLET | Freq: Every day | ORAL | 3 refills | Status: AC
Start: 2023-06-14 — End: ?
  Filled 2023-06-15 – 2023-07-16 (×2): qty 90, 90d supply, fill #0
  Filled 2023-09-26 – 2023-10-03 (×2): qty 90, 90d supply, fill #1
  Filled 2023-12-17 – 2023-12-26 (×4): qty 90, 90d supply, fill #2

## 2023-06-15 MED ORDER — OMEPRAZOLE 20 MG PO CPDR
40.0000 mg | DELAYED_RELEASE_CAPSULE | Freq: Every day | ORAL | 3 refills | Status: AC
  Filled 2023-06-15 – 2023-09-26 (×2): qty 180, 90d supply, fill #0
  Filled 2024-01-21 – 2024-01-22 (×2): qty 180, 90d supply, fill #1

## 2023-07-03 ENCOUNTER — Other Ambulatory Visit (HOSPITAL_COMMUNITY): Payer: Self-pay

## 2023-07-03 MED ORDER — WEGOVY 1.7 MG/0.75ML ~~LOC~~ SOAJ
1.7000 mg | SUBCUTANEOUS | 0 refills | Status: DC
  Filled 2023-07-03: qty 3, 28d supply, fill #0

## 2023-07-04 ENCOUNTER — Other Ambulatory Visit: Payer: Self-pay

## 2023-07-16 ENCOUNTER — Other Ambulatory Visit (HOSPITAL_COMMUNITY): Payer: Self-pay

## 2023-07-16 ENCOUNTER — Other Ambulatory Visit: Payer: Self-pay

## 2023-09-26 ENCOUNTER — Other Ambulatory Visit (HOSPITAL_COMMUNITY): Payer: Self-pay

## 2023-09-26 ENCOUNTER — Other Ambulatory Visit: Payer: Self-pay

## 2023-10-03 ENCOUNTER — Other Ambulatory Visit (HOSPITAL_COMMUNITY): Payer: Self-pay

## 2023-10-26 ENCOUNTER — Other Ambulatory Visit (HOSPITAL_BASED_OUTPATIENT_CLINIC_OR_DEPARTMENT_OTHER): Payer: Self-pay

## 2023-10-26 ENCOUNTER — Encounter: Payer: Self-pay | Admitting: Hematology

## 2023-10-26 MED ORDER — FLUZONE 0.5 ML IM SUSY
0.5000 mL | PREFILLED_SYRINGE | Freq: Once | INTRAMUSCULAR | 0 refills | Status: AC
Start: 2023-10-26 — End: 2023-10-27
  Filled 2023-10-26: qty 0.5, 1d supply, fill #0

## 2023-11-06 ENCOUNTER — Other Ambulatory Visit: Payer: Self-pay

## 2023-11-06 ENCOUNTER — Other Ambulatory Visit (HOSPITAL_COMMUNITY): Payer: Self-pay

## 2023-11-06 MED ORDER — TRAMADOL HCL 50 MG PO TABS
50.0000 mg | ORAL_TABLET | ORAL | 0 refills | Status: AC | PRN
  Filled 2023-11-06: qty 30, 5d supply, fill #0

## 2023-12-17 ENCOUNTER — Other Ambulatory Visit (HOSPITAL_COMMUNITY): Payer: Self-pay

## 2023-12-19 ENCOUNTER — Other Ambulatory Visit (HOSPITAL_COMMUNITY): Payer: Self-pay

## 2023-12-21 ENCOUNTER — Other Ambulatory Visit (HOSPITAL_COMMUNITY): Payer: Self-pay

## 2023-12-25 ENCOUNTER — Other Ambulatory Visit (HOSPITAL_COMMUNITY): Payer: Self-pay

## 2024-01-21 ENCOUNTER — Other Ambulatory Visit (HOSPITAL_COMMUNITY): Payer: Self-pay

## 2024-01-22 ENCOUNTER — Other Ambulatory Visit (HOSPITAL_COMMUNITY): Payer: Self-pay

## 2024-02-28 ENCOUNTER — Telehealth: Admitting: Physician Assistant

## 2024-02-28 ENCOUNTER — Other Ambulatory Visit (HOSPITAL_BASED_OUTPATIENT_CLINIC_OR_DEPARTMENT_OTHER): Payer: Self-pay

## 2024-02-28 ENCOUNTER — Encounter: Payer: Self-pay | Admitting: Hematology

## 2024-02-28 DIAGNOSIS — J069 Acute upper respiratory infection, unspecified: Secondary | ICD-10-CM

## 2024-02-28 DIAGNOSIS — B9689 Other specified bacterial agents as the cause of diseases classified elsewhere: Secondary | ICD-10-CM

## 2024-02-28 MED ORDER — PSEUDOEPH-BROMPHEN-DM 30-2-10 MG/5ML PO SYRP
5.0000 mL | ORAL_SOLUTION | Freq: Four times a day (QID) | ORAL | 0 refills | Status: AC | PRN
  Filled 2024-02-28: qty 120, 6d supply, fill #0

## 2024-02-28 MED ORDER — BENZONATATE 100 MG PO CAPS
100.0000 mg | ORAL_CAPSULE | Freq: Three times a day (TID) | ORAL | 0 refills | Status: AC | PRN
  Filled 2024-02-28: qty 30, 5d supply, fill #0

## 2024-02-28 MED ORDER — AZITHROMYCIN 250 MG PO TABS
ORAL_TABLET | ORAL | 0 refills | Status: AC
  Filled 2024-02-28: qty 6, 5d supply, fill #0

## 2024-02-28 NOTE — Progress Notes (Signed)
 " Virtual Visit Consent   Heidi Jordan, you are scheduled for a virtual visit with a [facility] provider today. Just as with appointments in the office, your consent must be obtained to participate. Your consent will be active for this visit and any virtual visit you may have with one of our providers in the next 365 days. If you have a MyChart account, a copy of this consent can be sent to you electronically.  As this is a virtual visit, video technology does not allow for your provider to perform a traditional examination. This may limit your provider's ability to fully assess your condition. If your provider identifies any concerns that need to be evaluated in person or the need to arrange testing (such as labs, EKG, etc.), we will make arrangements to do so. Although advances in technology are sophisticated, we cannot ensure that it will always work on either your end or our end. If the connection with a video visit is poor, the visit may have to be switched to a telephone visit. With either a video or telephone visit, we are not always able to ensure that we have a secure connection.  By engaging in this virtual visit, you consent to the provision of healthcare and authorize for your insurance to be billed (if applicable) for the services provided during this visit. Depending on your insurance coverage, you may receive a charge related to this service.  I need to obtain your verbal consent now. Are you willing to proceed with your visit today? Heidi Jordan has provided verbal consent on 02/28/2024 for a virtual visit (video or telephone). Heidi CHRISTELLA Dickinson, PA-C  Date: 02/28/2024 8:29 AM   Virtual Visit via Video Note   I, Heidi Jordan, connected with  Heidi Jordan  ([id number], Aug 18, 1965) on 02/28/24 at  8:15 AM EST by a video-enabled telemedicine application and verified that I am speaking with the correct person using two identifiers.  Location: Patient: Virtual  Visit Location Patient: Home Provider: Virtual Visit Location Provider: Home Office   I discussed the limitations of evaluation and management by telemedicine and the availability of in person appointments. The patient expressed understanding and agreed to proceed.    History of Present Illness: Heidi Jordan is a 59 y.o. who identifies as a female who was assigned female at birth, and is being seen today for URI symtpoms.  HPI: URI  This is a new problem. The current episode started in the past 7 days (Saturday, 02/23/24). The problem has been gradually worsening. Maximum temperature: 99-100. The fever has been present for 5 days or more. Associated symptoms include congestion, coughing (dry, barking), headaches and a sore throat (worsened by cough). Pertinent negatives include no chest pain, diarrhea, ear pain, nausea, plugged ear sensation, rhinorrhea, sinus pain, vomiting or wheezing. Associated symptoms comments: Nasal congestion. Treatments tried: tylenol , advil. The treatment provided no relief.    Problems:  Patient Active Problem List   Diagnosis Date Noted   Iron  deficiency anemia 07/01/2022   Back pain 08/26/2019   Neck pain 08/26/2019   Symptomatic mammary hypertrophy 08/26/2019   Panniculitis 08/26/2019    Allergies: Allergies[1] Medications: Current Medications[2]  Observations/Objective: Patient is well-developed, well-nourished in no acute distress.  Resting comfortably at home.  Head is normocephalic, atraumatic.  No labored breathing.  Speech is clear and coherent with logical content.  Patient is alert and oriented at baseline.  Harsh, barking cough heard a few times through the call  Assessment and Plan: 1. Bacterial upper respiratory infection (Primary) - azithromycin  (ZITHROMAX ) 250 MG tablet; Take 2 tablets on day 1, then 1 tablet daily on days 2 through 5  Dispense: 6 tablet; Refill: 0 - brompheniramine-pseudoephedrine-DM 30-2-10 MG/5ML syrup; Take 5  mLs by mouth 4 (four) times daily as needed.  Dispense: 120 mL; Refill: 0 - benzonatate  (TESSALON ) 100 MG capsule; Take 1-2 capsules (100-200 mg total) by mouth 3 (three) times daily as needed.  Dispense: 30 capsule; Refill: 0  - Worsening despite OTC medications - Will treat with Z-pack, Bromfed DM and tessalon  perles - Push fluids.  - Rest.  - Steam and humidifier can help - Seek in person evaluation if worsening or symptoms fail to improve    Follow Up Instructions: I discussed the assessment and treatment plan with the patient. The patient was provided an opportunity to ask questions and all were answered. The patient agreed with the plan and demonstrated an understanding of the instructions.  A copy of instructions were sent to the patient via MyChart unless otherwise noted below.    The patient was advised to call back or seek an in-person evaluation if the symptoms worsen or if the condition fails to improve as anticipated.    Heidi CHRISTELLA Dickinson, PA-C     [1]  Allergies Allergen Reactions   Sulfa Drugs Cross Reactors Other (See Comments)    casues migraine headaches  [2]  Current Outpatient Medications:    azithromycin  (ZITHROMAX ) 250 MG tablet, Take 2 tablets on day 1, then 1 tablet daily on days 2 through 5, Disp: 6 tablet, Rfl: 0   benzonatate  (TESSALON ) 100 MG capsule, Take 1-2 capsules (100-200 mg total) by mouth 3 (three) times daily as needed., Disp: 30 capsule, Rfl: 0   brompheniramine-pseudoephedrine-DM 30-2-10 MG/5ML syrup, Take 5 mLs by mouth 4 (four) times daily as needed., Disp: 120 mL, Rfl: 0   amitriptyline  (ELAVIL ) 50 MG tablet, Take 50 mg by mouth at bedtime., Disp: , Rfl:    amitriptyline  (ELAVIL ) 50 MG tablet, TAKE 1 TABLET BY MOUTH AT BEDTIME, Disp: 90 tablet, Rfl: 3   amitriptyline  (ELAVIL ) 50 MG tablet, Take 1 tablet by mouth at bedtime., Disp: 90 tablet, Rfl: 0   amitriptyline  (ELAVIL ) 50 MG tablet, Take 1 tablet (50 mg total) by mouth once a day at  bedtime as needed., Disp: 90 tablet, Rfl: 3   amitriptyline  (ELAVIL ) 50 MG tablet, Take 1 tablet (50 mg total) by mouth daily., Disp: 90 tablet, Rfl: 3   Cyanocobalamin  (B-12) 1000 MCG SUBL, Place 1,000 mcg under the tongue daily., Disp: 30 tablet, Rfl: 3   ergocalciferol  (VITAMIN D2) 1.25 MG (50000 UT) capsule, Take 1 capsule (50,000 Units total) by mouth once a week., Disp: 12 capsule, Rfl: 0   omeprazole  (PRILOSEC) 20 MG capsule, TAKE 2 CAPSULES BY MOUTH DAILY, Disp: 180 capsule, Rfl: 3   omeprazole  (PRILOSEC) 20 MG capsule, Take 2 capsules by mouth daily., Disp: 180 capsule, Rfl: 0   omeprazole  (PRILOSEC) 20 MG capsule, Take 2 capsules (40 mg total) by mouth daily., Disp: 180 capsule, Rfl: 3   omeprazole  (PRILOSEC) 40 MG capsule, Take 40 mg by mouth daily., Disp: , Rfl:    pregabalin  (LYRICA ) 50 MG capsule, Take 1 capsule (50 mg total) by mouth 2 (two) times daily., Disp: 60 capsule, Rfl: 0   pregabalin  (LYRICA ) 75 MG capsule, Take 1 capsule (75 mg total) by mouth 2 (two) times daily., Disp: 60 capsule, Rfl: 0   pregabalin  (  LYRICA ) 75 MG capsule, Take 1 capsule (75 mg total) by mouth 2 (two) times daily., Disp: 180 capsule, Rfl: 0   Semaglutide -Weight Management (WEGOVY ) 0.25 MG/0.5ML SOAJ, Inject 0.5 mL into the skin once a week, Disp: 2 mL, Rfl: 1   Semaglutide -Weight Management (WEGOVY ) 0.5 MG/0.5ML SOAJ, Inject 0.5 mg into the skin once a week as directed., Disp: 2 mL, Rfl: 0   Semaglutide -Weight Management (WEGOVY ) 1 MG/0.5ML SOAJ, Inject 0.5 mL subcutaneously once a week as directed., Disp: 2 mL, Rfl: 1   Semaglutide -Weight Management (WEGOVY ) 1 MG/0.5ML SOAJ, Inject 1 mg into the skin once a week., Disp: 2 mL, Rfl: 1   Semaglutide -Weight Management (WEGOVY ) 1 MG/0.5ML SOAJ, Inject 1 mg into the skin every 7 (seven) days., Disp: 2 mL, Rfl: 4   traMADol  (ULTRAM ) 50 MG tablet, TAKE 1 TABLET BY MOUTH EVERY 6 HOURS AS NEEDED, Disp: 10 tablet, Rfl: 0   traMADol  (ULTRAM ) 50 MG tablet, Take 1  tablet (50 mg total) by mouth every 4 (four) hours as needed. (Patient not taking: Reported on 07/17/2022), Disp: 30 tablet, Rfl: 0   traMADol  (ULTRAM ) 50 MG tablet, Take 1 tablet by mouth every 4 hours as needed. (30 day supply per MD) (Patient not taking: Reported on 07/17/2022), Disp: 30 tablet, Rfl: 0   traMADol  (ULTRAM ) 50 MG tablet, Take 1 tablet by mouth every 4 hours as needed (30 day supply) (Patient not taking: Reported on 07/17/2022), Disp: 30 tablet, Rfl: 0   traMADol  (ULTRAM ) 50 MG tablet, Take 1 tablet (50 mg total) by mouth every 4 (four) hours as needed. (Patient not taking: Reported on 07/17/2022), Disp: 30 tablet, Rfl: 0   traMADol  (ULTRAM ) 50 MG tablet, Take 1 tablet (50 mg total) by mouth every 4 (four) hours as needed. (Patient not taking: Reported on 07/17/2022), Disp: 30 tablet, Rfl: 0   traMADol  (ULTRAM ) 50 MG tablet, Take 1 tablet (50 mg total) by mouth every 4 (four) hours as needed., Disp: 30 tablet, Rfl: 0   traMADol  (ULTRAM ) 50 MG tablet, Take 1 tablet (50 mg total) by mouth every 4 (four) hours as needed., Disp: 30 tablet, Rfl: 0  "

## 2024-02-28 NOTE — Patient Instructions (Signed)
 " Heidi Jordan, thank you for joining [name], PA-C for today's virtual visit.  While this provider is not your primary care provider (PCP), if your PCP is located in our provider database this encounter information will be shared with them immediately following your visit.   A [facility] MyChart account gives you access to today's visit and all your visits, tests, and labs performed at [facility]  click here if you don't have a [facility] MyChart account or go to [email or web address]  Consent: (Patient) Heidi Jordan provided verbal consent for this virtual visit at the beginning of the encounter.  Current Medications:  Current Outpatient Medications:    azithromycin  (ZITHROMAX ) 250 MG tablet, Take 2 tablets on day 1, then 1 tablet daily on days 2 through 5, Disp: 6 tablet, Rfl: 0   benzonatate  (TESSALON ) 100 MG capsule, Take 1-2 capsules (100-200 mg total) by mouth 3 (three) times daily as needed., Disp: 30 capsule, Rfl: 0   brompheniramine-pseudoephedrine-DM 30-2-10 MG/5ML syrup, Take 5 mLs by mouth 4 (four) times daily as needed., Disp: 120 mL, Rfl: 0   amitriptyline  (ELAVIL ) 50 MG tablet, Take 50 mg by mouth at bedtime., Disp: , Rfl:    amitriptyline  (ELAVIL ) 50 MG tablet, TAKE 1 TABLET BY MOUTH AT BEDTIME, Disp: 90 tablet, Rfl: 3   amitriptyline  (ELAVIL ) 50 MG tablet, Take 1 tablet by mouth at bedtime., Disp: 90 tablet, Rfl: 0   amitriptyline  (ELAVIL ) 50 MG tablet, Take 1 tablet (50 mg total) by mouth once a day at bedtime as needed., Disp: 90 tablet, Rfl: 3   amitriptyline  (ELAVIL ) 50 MG tablet, Take 1 tablet (50 mg total) by mouth daily., Disp: 90 tablet, Rfl: 3   Cyanocobalamin  (B-12) 1000 MCG SUBL, Place 1,000 mcg under the tongue daily., Disp: 30 tablet, Rfl: 3   ergocalciferol  (VITAMIN D2) 1.25 MG (50000 UT) capsule, Take 1 capsule (50,000 Units total) by mouth once a week., Disp: 12 capsule, Rfl: 0   omeprazole  (PRILOSEC) 20 MG capsule,  TAKE 2 CAPSULES BY MOUTH DAILY, Disp: 180 capsule, Rfl: 3   omeprazole  (PRILOSEC) 20 MG capsule, Take 2 capsules by mouth daily., Disp: 180 capsule, Rfl: 0   omeprazole  (PRILOSEC) 20 MG capsule, Take 2 capsules (40 mg total) by mouth daily., Disp: 180 capsule, Rfl: 3   omeprazole  (PRILOSEC) 40 MG capsule, Take 40 mg by mouth daily., Disp: , Rfl:    pregabalin  (LYRICA ) 50 MG capsule, Take 1 capsule (50 mg total) by mouth 2 (two) times daily., Disp: 60 capsule, Rfl: 0   pregabalin  (LYRICA ) 75 MG capsule, Take 1 capsule (75 mg total) by mouth 2 (two) times daily., Disp: 60 capsule, Rfl: 0   pregabalin  (LYRICA ) 75 MG capsule, Take 1 capsule (75 mg total) by mouth 2 (two) times daily., Disp: 180 capsule, Rfl: 0   Semaglutide -Weight Management (WEGOVY ) 0.25 MG/0.5ML SOAJ, Inject 0.5 mL into the skin once a week, Disp: 2 mL, Rfl: 1   Semaglutide -Weight Management (WEGOVY ) 0.5 MG/0.5ML SOAJ, Inject 0.5 mg into the skin once a week as directed., Disp: 2 mL, Rfl: 0   Semaglutide -Weight Management (WEGOVY ) 1 MG/0.5ML SOAJ, Inject 0.5 mL subcutaneously once a week as directed., Disp: 2 mL, Rfl: 1   Semaglutide -Weight Management (WEGOVY ) 1 MG/0.5ML SOAJ, Inject 1 mg into the skin once a week., Disp: 2 mL, Rfl: 1   Semaglutide -Weight Management (WEGOVY ) 1 MG/0.5ML SOAJ, Inject 1 mg into the skin every 7 (seven) days., Disp: 2 mL, Rfl: 4  traMADol  (ULTRAM ) 50 MG tablet, TAKE 1 TABLET BY MOUTH EVERY 6 HOURS AS NEEDED, Disp: 10 tablet, Rfl: 0   traMADol  (ULTRAM ) 50 MG tablet, Take 1 tablet (50 mg total) by mouth every 4 (four) hours as needed. (Patient not taking: Reported on 07/17/2022), Disp: 30 tablet, Rfl: 0   traMADol  (ULTRAM ) 50 MG tablet, Take 1 tablet by mouth every 4 hours as needed. (30 day supply per MD) (Patient not taking: Reported on 07/17/2022), Disp: 30 tablet, Rfl: 0   traMADol  (ULTRAM ) 50 MG tablet, Take 1 tablet by mouth every 4 hours as needed (30 day supply) (Patient not taking: Reported on  07/17/2022), Disp: 30 tablet, Rfl: 0   traMADol  (ULTRAM ) 50 MG tablet, Take 1 tablet (50 mg total) by mouth every 4 (four) hours as needed. (Patient not taking: Reported on 07/17/2022), Disp: 30 tablet, Rfl: 0   traMADol  (ULTRAM ) 50 MG tablet, Take 1 tablet (50 mg total) by mouth every 4 (four) hours as needed. (Patient not taking: Reported on 07/17/2022), Disp: 30 tablet, Rfl: 0   traMADol  (ULTRAM ) 50 MG tablet, Take 1 tablet (50 mg total) by mouth every 4 (four) hours as needed., Disp: 30 tablet, Rfl: 0   traMADol  (ULTRAM ) 50 MG tablet, Take 1 tablet (50 mg total) by mouth every 4 (four) hours as needed., Disp: 30 tablet, Rfl: 0   Medications ordered in this encounter:  Meds ordered this encounter  Medications   azithromycin  (ZITHROMAX ) 250 MG tablet    Sig: Take 2 tablets on day 1, then 1 tablet daily on days 2 through 5    Dispense:  6 tablet    Refill:  0    Supervising Provider:   LAMPTEY, PHILIP O [[id number]]   brompheniramine-pseudoephedrine-DM 30-2-10 MG/5ML syrup    Sig: Take 5 mLs by mouth 4 (four) times daily as needed.    Dispense:  120 mL    Refill:  0    Supervising Provider:   LAMPTEY, PHILIP O [id number]   benzonatate  (TESSALON ) 100 MG capsule    Sig: Take 1-2 capsules (100-200 mg total) by mouth 3 (three) times daily as needed.    Dispense:  30 capsule    Refill:  0    Supervising Provider:   BLAISE ALEENE KIDD [[id number]]     *If you need refills on other medications prior to your next appointment, please contact your pharmacy*  Follow-Up: Call back or seek an in-person evaluation if the symptoms worsen or if the condition fails to improve as anticipated.  [facility] [phone]  Other Instructions Upper Respiratory Infection, Adult An upper respiratory infection (URI) is a common viral infection of the nose, throat, and upper air passages that lead to the lungs. The most common type of URI is the common cold. URIs usually get better on their own, without  medical treatment. What are the causes? A URI is caused by a virus. You may catch a virus by: Breathing in droplets from an infected person's cough or sneeze. Touching something that has been exposed to the virus (is contaminated) and then touching your mouth, nose, or eyes. What increases the risk? You are more likely to get a URI if: You are very young or very old. You have close contact with others, such as at work, school, or a health care facility. You smoke. You have long-term (chronic) heart or lung disease. You have a weakened disease-fighting system (immune system). You have nasal allergies or asthma. You are experiencing  a lot of stress. You have poor nutrition. What are the signs or symptoms? A URI usually involves some of the following symptoms: Runny or stuffy (congested) nose. Cough. Sneezing. Sore throat. Headache. Fatigue. Fever. Loss of appetite. Pain in your forehead, behind your eyes, and over your cheekbones (sinus pain). Muscle aches. Redness or irritation of the eyes. Pressure in the ears or face. How is this diagnosed? This condition may be diagnosed based on your medical history and symptoms, and a physical exam. Your health care provider may use a swab to take a mucus sample from your nose (nasal swab). This sample can be tested to determine what virus is causing the illness. How is this treated? URIs usually get better on their own within 7-10 days. Medicines cannot cure URIs, but your health care provider may recommend certain medicines to help relieve symptoms, such as: Over-the-counter cold medicines. Cough suppressants. Coughing is a type of defense against infection that helps to clear the respiratory system, so take these medicines only as recommended by your health care provider. Fever-reducing medicines. Follow these instructions at home: Activity Rest as needed. If you have a fever, stay home from work or school until your fever is gone or  until your health care provider says your URI cannot spread to other people (is no longer contagious). Your health care provider may have you wear a face mask to prevent your infection from spreading. Relieving symptoms Gargle with a mixture of salt and water 3-4 times a day or as needed. To make salt water, completely dissolve -1 tsp (3-6 g) of salt in 1 cup (237 mL) of warm water. Use a cool-mist humidifier to add moisture to the air. This can help you breathe more easily. Eating and drinking  Drink enough fluid to keep your urine pale yellow. Eat soups and other clear broths. General instructions  Take over-the-counter and prescription medicines only as told by your health care provider. These include cold medicines, fever reducers, and cough suppressants. Do not use any products that contain nicotine or tobacco. These products include cigarettes, chewing tobacco, and vaping devices, such as e-cigarettes. If you need help quitting, ask your health care provider. Stay away from secondhand smoke. Stay up to date on all immunizations, including the yearly (annual) flu vaccine. Keep all follow-up visits. This is important. How to prevent the spread of infection to others URIs can be contagious. To prevent the infection from spreading: Wash your hands with soap and water for at least 20 seconds. If soap and water are not available, use hand sanitizer. Avoid touching your mouth, face, eyes, or nose. Cough or sneeze into a tissue or your sleeve or elbow instead of into your hand or into the air.  Contact a health care provider if: You are getting worse instead of better. You have a fever or chills. Your mucus is brown or red. You have yellow or brown discharge coming from your nose. You have pain in your face, especially when you bend forward. You have swollen neck glands. You have pain while swallowing. You have white areas in the back of your throat. Get help right away if: You have  shortness of breath that gets worse. You have severe or persistent: Headache. Ear pain. Sinus pain. Chest pain. You have chronic lung disease along with any of the following: Making high-pitched whistling sounds when you breathe, most often when you breathe out (wheezing). Prolonged cough (more than 14 days). Coughing up blood. A change in your usual  mucus. You have a stiff neck. You have changes in your: Vision. Hearing. Thinking. Mood. These symptoms may be an emergency. Get help right away. Call 911. Do not wait to see if the symptoms will go away. Do not drive yourself to the hospital. Summary An upper respiratory infection (URI) is a common infection of the nose, throat, and upper air passages that lead to the lungs. A URI is caused by a virus. URIs usually get better on their own within 7-10 days. Medicines cannot cure URIs, but your health care provider may recommend certain medicines to help relieve symptoms. This information is not intended to replace advice given to you by your health care provider. Make sure you discuss any questions you have with your health care provider. Document Revised: 08/18/2020 Document Reviewed: 08/18/2020 Elsevier Patient Education  2024 [employer]   If you have been instructed to have an in-person evaluation today at a local Urgent Care facility, please use the link below. It will take you to a list of all of our available [facility], including address, phone number and hours of operation. Please do not delay care.  [facility]  If you or a family member do not have a primary care provider, use the link below to schedule a visit and establish care. When you choose a [facility] primary care physician or advanced practice provider, you gain a long-term partner in health. Find a Primary Care Provider  Learn more about [facility]'s in-office and virtual care options: [facility] - Get Care Now "
